# Patient Record
Sex: Female | Born: 1980 | Hispanic: No | Marital: Married | Smoking: Never smoker
Health system: Southern US, Community
[De-identification: ages and names within clinical notes are randomized; demographics above are authoritative.]

## PROBLEM LIST (undated history)

## (undated) ENCOUNTER — Inpatient Hospital Stay (HOSPITAL_COMMUNITY): Payer: Self-pay

## (undated) DIAGNOSIS — Z789 Other specified health status: Secondary | ICD-10-CM

## (undated) DIAGNOSIS — F419 Anxiety disorder, unspecified: Secondary | ICD-10-CM

## (undated) HISTORY — PX: DILATION AND CURETTAGE OF UTERUS: SHX78

---

## 2015-05-06 ENCOUNTER — Inpatient Hospital Stay (HOSPITAL_COMMUNITY): Payer: 59

## 2015-05-06 ENCOUNTER — Inpatient Hospital Stay (HOSPITAL_COMMUNITY)
Admission: AD | Admit: 2015-05-06 | Discharge: 2015-05-06 | Disposition: A | Discharge status: Home / Self Care | Payer: 59 | Source: Ambulatory Visit | Attending: Obstetrics and Gynecology | Admitting: Obstetrics and Gynecology

## 2015-05-06 ENCOUNTER — Encounter (HOSPITAL_COMMUNITY): Payer: Self-pay | Admitting: *Deleted

## 2015-05-06 DIAGNOSIS — E039 Hypothyroidism, unspecified: Secondary | ICD-10-CM | POA: Diagnosis present

## 2015-05-06 DIAGNOSIS — O3431 Maternal care for cervical incompetence, first trimester: Secondary | ICD-10-CM | POA: Diagnosis not present

## 2015-05-06 DIAGNOSIS — O2620 Pregnancy care for patient with recurrent pregnancy loss, unspecified trimester: Secondary | ICD-10-CM | POA: Diagnosis present

## 2015-05-06 DIAGNOSIS — O26851 Spotting complicating pregnancy, first trimester: Secondary | ICD-10-CM | POA: Diagnosis not present

## 2015-05-06 DIAGNOSIS — Z3A01 Less than 8 weeks gestation of pregnancy: Secondary | ICD-10-CM | POA: Diagnosis not present

## 2015-05-06 DIAGNOSIS — N979 Female infertility, unspecified: Secondary | ICD-10-CM | POA: Diagnosis present

## 2015-05-06 DIAGNOSIS — O09299 Supervision of pregnancy with other poor reproductive or obstetric history, unspecified trimester: Secondary | ICD-10-CM

## 2015-05-06 DIAGNOSIS — O26859 Spotting complicating pregnancy, unspecified trimester: Secondary | ICD-10-CM

## 2015-05-06 LAB — ABO/RH: ABO/RH(D): A POS

## 2015-05-06 LAB — HCG, QUANTITATIVE, PREGNANCY: HCG, BETA CHAIN, QUANT, S: 23510 m[IU]/mL — AB (ref <5)

## 2015-05-06 NOTE — MAU Provider Note (Signed)
History   34 yo [id number] at 5 4/7 weeks presented unannounced c/o pinkish-brown spotting this am and mild cramping. Reports cramping as 2/10.  Hx remarkable for cerclage x 2, SAB x 3 (one with twins).  On vaginal progesterone suppositories.  Patient questions whether cerclage needed in this pregnancy.  In last pregnancy, received prophylactic cerclage, vaginal progesterone, Makena.  Advised patient plan of care would be appropriate to review with [facility] MD and likely, MFM.   Patient Active Problem List   Diagnosis Date Noted  . H/O incompetent cervix, currently pregnant--cerclage x 2 05/06/2015  . 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 Infertility, female 05/06/2015  . Spotting affecting pregnancy in first trimester 05/06/2015  . Hypothyroidism 05/06/2015    Chief Complaint  Patient presents with  . Abdominal Pain  . Vaginal Bleeding   HPI:  As above  OB History    Gravida Para Term Preterm AB TAB SAB Ectopic Multiple Living   1st pregnancy--SAB of twins--D&C 2 additional pregnancies with early SABs--D&Cs  2014--SVB, cerclage placed at 20 weeks due to effacement to 50%, delivered within few days of cerclage removal 2015--SVB, prophylactic cerclage placed, persistent previa that resolved late in pregnancy, delivered same day as cerclage removed  ? Uterine septum noted in past   Surgical Hx:  D&C x 3, cerclage x 2.   History  Substance Use Topics  . Smoking status: Not on file  . Smokeless tobacco: Not on file  . Alcohol Use: Not on file    Allergies: No Known Allergies  Prescriptions prior to admission  Medication Sig Dispense Refill Last Dose  . Docosahexaenoic Acid (DHA PO) Take 2 tablets by mouth.   Past Week at Unknown time  . Prenatal Vit-Fe Fumarate-FA (MULTIVITAMIN-PRENATAL) 27-0.8 MG TABS tablet Take 1 tablet by mouth daily at 12 noon.   05/05/2015 at Unknown time  . progesterone  (PROMETRIUM) 100 MG capsule Place 100 mg vaginally daily.   Past Week at Unknown time    ROS:  Vaginal spotting, mild cramping Physical Exam   Blood pressure 118/66, pulse 86, temperature 98 F (36.7 C), temperature source Oral, resp. rate 16, height  (1.575 m), weight 54.999 kg (121 lb 4 oz), last menstrual period 03/28/2015.    Physical Exam  In NAD Chest clear Heart RRR without murmur Abd soft, NT Pelvic--no blood in vault, small amount of light brown d/c.  Cervix appears closed, non-friable.  Long, closed, and firm to exam. Ext WNL  Recent Results (from the past 2160 hour(s))  ABO/Rh     Status: None (Preliminary result)   Collection Time: 05/06/15 10:29 AM  Result Value Ref Range   ABO/RH(D) A POS   hCG, quantitative, pregnancy     Status: Abnormal   Collection Time: 05/06/15 10:29 AM  Result Value Ref Range   hCG, Beta Chain, Quant, S 23510 (H) <5 mIU/mL    Comment:          GEST. AGE      CONC.  (mIU/mL)   <=1 WEEK        5 - 50     2 WEEKS       50 - 500     3 WEEKS       100 - 10,000     4 WEEKS     1,000 - 30,000     5  WEEKS     3,500 - 115,000   6-8 WEEKS     12,000 - 270,000    12 WEEKS     15,000 - 220,000        FEMALE AND NON-PREGNANT FEMALE:     LESS THAN 5 mIU/mL     ED Course  Assessment: Early pregnancy with spotting/mild cramping Hx recurrent SAB Hx incompetent cervix, with cerclage x 2  Plan: US for viability GC/chlamydia   Nigel BridgemanLATHAM, Emmarae Cowdery CNM, MSN 05/06/2015 12:28 PM  Addendum: Returned from US--no bleeding, very minor cramping.  US:  SIUGS, +YS, no fetal pole, no SCH, MSD 1.15, c/w 5 6/7 weeks.  Left ovary WNL, right ovary with CLC noted, small amount FF.  Reviewed findings with patient and husband, discussed limited information available in early gestation.  They are aware of possibilities of viable or non-viable pregnancy. Recommended f/u US in 1 week, with bleeding precautions. Has scheduled NOB interview, w/u, and US on 7/18  (US to follow those appts).  Nigel BridgemanVicki Jade Burright, CNM 05/06/15 2:45p   Support to patient and husband for desired pregnancy. Note to office to schedule f/u US in 1 week.

## 2015-05-06 NOTE — Discharge Instructions (Signed)
Vaginal Bleeding During Pregnancy, First Trimester  A small amount of bleeding (spotting) from the vagina is relatively common in early pregnancy. It usually stops on its own. Various things may cause bleeding or spotting in early pregnancy. Some bleeding may be related to the pregnancy, and some may not. In most cases, the bleeding is normal and is not a problem. However, bleeding can also be a sign of something serious. Be sure to tell your health care provider about any vaginal bleeding right away.  Some possible causes of vaginal bleeding during the first trimester include:  · Infection or inflammation of the cervix.  · Growths (polyps) on the cervix.  · Miscarriage or threatened miscarriage.  · Pregnancy tissue has developed outside of the uterus and in a fallopian tube (tubal pregnancy).  · Tiny cysts have developed in the uterus instead of pregnancy tissue (molar pregnancy).  HOME CARE INSTRUCTIONS   Watch your condition for any changes. The following actions may help to lessen any discomfort you are feeling:  · Follow your health care provider's instructions for limiting your activity. If your health care provider orders bed rest, you may need to stay in bed and only get up to use the bathroom. However, your health care provider may allow you to continue light activity.  · If needed, make plans for someone to help with your regular activities and responsibilities while you are on bed rest.  · Keep track of the number of pads you use each day, how often you change pads, and how soaked (saturated) they are. Write this down.  · Do not use tampons. Do not douche.  · Do not have sexual intercourse or orgasms until approved by your health care provider.  · If you pass any tissue from your vagina, save the tissue so you can show it to your health care provider.  · Only take over-the-counter or prescription medicines as directed by your health care provider.  · Do not take aspirin because it can make you  bleed.  · Keep all follow-up appointments as directed by your health care provider.  SEEK MEDICAL CARE IF:  · You have any vaginal bleeding during any part of your pregnancy.  · You have cramps or labor pains.  · You have a fever, not controlled by medicine.  SEEK IMMEDIATE MEDICAL CARE IF:   · You have severe cramps in your back or belly (abdomen).  · You pass large clots or tissue from your vagina.  · Your bleeding increases.  · You feel light-headed or weak, or you have fainting episodes.  · You have chills.  · You are leaking fluid or have a gush of fluid from your vagina.  · You pass out while having a bowel movement.  MAKE SURE YOU:  · Understand these instructions.  · Will watch your condition.  · Will get help right away if you are not doing well or get worse.  Document Released: 07/24/2005 Document Revised: 10/19/2013 Document Reviewed: 06/21/2013  ExitCare® Patient Information ©2015 [employer]. This information is not intended to replace advice given to you by your health care provider. Make sure you discuss any questions you have with your health care provider.

## 2015-05-06 NOTE — MAU Note (Signed)
Pt states here for spotting that began this am. Noted when wiping only. Hx miscarriages and is anxious to make sure pregnancy is ok.

## 2015-05-08 LAB — GC/CHLAMYDIA PROBE AMP (~~LOC~~) NOT AT ARMC
CHLAMYDIA, DNA PROBE: NEGATIVE
Neisseria Gonorrhea: NEGATIVE

## 2015-06-14 LAB — OB RESULTS CONSOLE RPR: RPR: NONREACTIVE

## 2015-06-14 LAB — OB RESULTS CONSOLE HIV ANTIBODY (ROUTINE TESTING)
HIV: NONREACTIVE
HIV: NONREACTIVE

## 2015-06-14 LAB — OB RESULTS CONSOLE ABO/RH: RH Type: POSITIVE

## 2015-06-14 LAB — OB RESULTS CONSOLE ANTIBODY SCREEN: ANTIBODY SCREEN: NEGATIVE

## 2015-06-14 LAB — OB RESULTS CONSOLE RUBELLA ANTIBODY, IGM: RUBELLA: IMMUNE

## 2015-06-14 LAB — OB RESULTS CONSOLE HEPATITIS B SURFACE ANTIGEN: HEP B S AG: NEGATIVE

## 2015-06-16 ENCOUNTER — Other Ambulatory Visit: Payer: Self-pay | Admitting: Obstetrics and Gynecology

## 2015-07-05 ENCOUNTER — Ambulatory Visit (HOSPITAL_COMMUNITY): Payer: 59 | Admitting: Anesthesiology

## 2015-07-05 ENCOUNTER — Encounter (HOSPITAL_COMMUNITY): Payer: Self-pay | Admitting: *Deleted

## 2015-07-05 ENCOUNTER — Ambulatory Visit (HOSPITAL_COMMUNITY)
Admission: RE | Admit: 2015-07-05 | Discharge: 2015-07-05 | Disposition: A | Discharge status: Home / Self Care | Payer: 59 | Source: Ambulatory Visit | Attending: Obstetrics and Gynecology | Admitting: Obstetrics and Gynecology

## 2015-07-05 ENCOUNTER — Encounter (HOSPITAL_COMMUNITY)
Admission: RE | Disposition: A | Discharge status: Home / Self Care | Payer: Self-pay | Source: Ambulatory Visit | Attending: Obstetrics and Gynecology

## 2015-07-05 DIAGNOSIS — O3432 Maternal care for cervical incompetence, second trimester: Secondary | ICD-10-CM | POA: Insufficient documentation

## 2015-07-05 DIAGNOSIS — N883 Incompetence of cervix uteri: Secondary | ICD-10-CM | POA: Diagnosis present

## 2015-07-05 DIAGNOSIS — Z3A14 14 weeks gestation of pregnancy: Secondary | ICD-10-CM | POA: Insufficient documentation

## 2015-07-05 HISTORY — PX: CERVICAL CERCLAGE: SHX1329

## 2015-07-05 HISTORY — DX: Other specified health status: Z78.9

## 2015-07-05 HISTORY — DX: Anxiety disorder, unspecified: F41.9

## 2015-07-05 LAB — CBC
HEMATOCRIT: 38.4 % (ref 36.0–46.0)
Hemoglobin: 13 g/dL (ref 12.0–15.0)
MCH: 30 pg (ref 26.0–34.0)
MCHC: 33.9 g/dL (ref 30.0–36.0)
MCV: 88.5 fL (ref 78.0–100.0)
Platelets: 262 10*3/uL (ref 150–400)
RBC: 4.34 MIL/uL (ref 3.87–5.11)
RDW: 12.8 % (ref 11.5–15.5)
WBC: 14.9 10*3/uL — ABNORMAL HIGH (ref 4.0–10.5)

## 2015-07-05 SURGERY — CERCLAGE, CERVIX, VAGINAL APPROACH
Anesthesia: Spinal

## 2015-07-05 MED ORDER — ACETAMINOPHEN 160 MG/5ML PO SOLN
975.0000 mg | Freq: Once | ORAL | Status: AC
  Administered 2015-07-05: 975 mg via ORAL

## 2015-07-05 MED ORDER — SODIUM CHLORIDE 0.9 % IV SOLN
2.0000 g | Freq: Once | INTRAVENOUS | Status: AC
  Administered 2015-07-05: 2 g via INTRAVENOUS
  Filled 2015-07-05: qty 2000

## 2015-07-05 MED ORDER — FENTANYL CITRATE (PF) 100 MCG/2ML IJ SOLN: 25.0000 ug | INTRAMUSCULAR | Status: DC | PRN

## 2015-07-05 MED ORDER — SCOPOLAMINE 1 MG/3DAYS TD PT72
1.0000 | MEDICATED_PATCH | TRANSDERMAL | Status: DC
Start: 2015-07-05 — End: 2015-07-05
  Administered 2015-07-05: 1.5 mg via TRANSDERMAL

## 2015-07-05 MED ORDER — SCOPOLAMINE 1 MG/3DAYS TD PT72
MEDICATED_PATCH | TRANSDERMAL | Status: AC
  Administered 2015-07-05: 1.5 mg via TRANSDERMAL
  Filled 2015-07-05: qty 1

## 2015-07-05 MED ORDER — LIDOCAINE IN DEXTROSE 5-7.5 % IV SOLN
INTRAVENOUS | Status: DC | PRN
  Administered 2015-07-05: 1 mL via INTRATHECAL

## 2015-07-05 MED ORDER — ACETAMINOPHEN 160 MG/5ML PO SOLN
ORAL | Status: AC
  Administered 2015-07-05: 975 mg via ORAL
  Filled 2015-07-05: qty 40.6

## 2015-07-05 MED ORDER — SODIUM CHLORIDE 0.9 % IJ SOLN
Freq: Once | INTRAMUSCULAR | Status: AC
  Administered 2015-07-05: 30 mL via VAGINAL
  Filled 2015-07-05: qty 1

## 2015-07-05 MED ORDER — LACTATED RINGERS IV SOLN
INTRAVENOUS | Status: DC
  Administered 2015-07-05 (×2): via INTRAVENOUS

## 2015-07-05 SURGICAL SUPPLY — 19 items
CLOTH BEACON ORANGE TIMEOUT ST (SAFETY) ×3 IMPLANT
COUNTER NEEDLE 1200 MAGNETIC (NEEDLE) IMPLANT
GLOVE BIO SURGEON STRL SZ7.5 (GLOVE) ×3 IMPLANT
GLOVE BIOGEL PI IND STRL 7.5 (GLOVE) ×1 IMPLANT
GLOVE BIOGEL PI INDICATOR 7.5 (GLOVE) ×2
GOWN STRL REUS W/TWL LRG LVL3 (GOWN DISPOSABLE) ×6 IMPLANT
NS IRRIG 1000ML POUR BTL (IV SOLUTION) ×3 IMPLANT
PACK VAGINAL MINOR WOMEN LF (CUSTOM PROCEDURE TRAY) ×3 IMPLANT
PAD OB MATERNITY 4.3X12.25 (PERSONAL CARE ITEMS) ×3 IMPLANT
PAD PREP 24X48 CUFFED NSTRL (MISCELLANEOUS) ×3 IMPLANT
SUT MERSILENE 5MM BP 1 12 (SUTURE) ×3 IMPLANT
SUT PROLENE 1 CT 1 30 (SUTURE) ×3 IMPLANT
SYR BULB IRRIGATION 50ML (SYRINGE) ×3 IMPLANT
TOWEL OR 17X24 6PK STRL BLUE (TOWEL DISPOSABLE) ×3 IMPLANT
TRAY FOLEY CATH SILVER 14FR (SET/KITS/TRAYS/PACK) ×3 IMPLANT
TUBING NON-CON 1/4 X 20 CONN (TUBING) IMPLANT
TUBING NON-CON 1/4 X 20' CONN (TUBING)
WATER STERILE IRR 1000ML POUR (IV SOLUTION) ×3 IMPLANT
YANKAUER SUCT BULB TIP NO VENT (SUCTIONS) IMPLANT

## 2015-07-05 NOTE — Anesthesia Procedure Notes (Signed)
Spinal  Patient location during procedure: OR Preanesthetic Checklist Completed: patient identified, site marked, surgical consent, pre-op evaluation, timeout performed, IV checked, risks and benefits discussed and monitors and equipment checked Spinal Block Patient position: sitting Prep: DuraPrep Patient monitoring: heart rate, cardiac monitor, continuous pulse ox and blood pressure Approach: midline Location: L3-4 Injection technique: single-shot Needle Needle type: Sprotte  Needle gauge: 24 G Needle length: 9 cm Assessment Sensory level: T10 Additional Notes Spinal Dosage in OR  5% Xylocaine ml       1.0   

## 2015-07-05 NOTE — Discharge Instructions (Signed)
°  Cervical Cerclage, Care After °Refer to this sheet in the next few weeks. These instructions provide you with information on caring for yourself after your procedure. Your health care provider may also give you more specific instructions. Your treatment has been planned according to current medical practices, but problems sometimes occur. Call your health care provider if you have any problems or questions after your procedure. °WHAT TO EXPECT AFTER THE PROCEDURE  °After your procedure, it is typical to have the following: °· Abdominal cramping. °· Vaginal spotting. °HOME CARE INSTRUCTIONS  °· Only take over-the-counter or prescription medicines for pain, discomfort, or fever as directed by your health care provider. °· Avoid physical activities and exercise until your health care provider says it is okay. °· Do not douche or have sexual intercourse until your health care provider tells you it is okay. °· Keep your follow-up surgical and prenatal appointments with your health care provider. °SEEK MEDICAL CARE IF:  °· You have abnormal vaginal discharge. °· You have a rash. °· You become lightheaded or feel faint. °· You have abdominal pain that is not controlled with pain medicine. °SEEK IMMEDIATE MEDICAL CARE IF:  °· You develop vaginal bleeding. °· You are leaking fluid or have a gush of fluid from the vagina. °· You have a fever. °· You faint. °· You have uterine contractions. °· You feel your baby is not moving as much as usual, or you cannot feel your baby move. °· You have chest pain or shortness of breath. °Document Released: 08/04/2013 Document Revised: 10/19/2013 Document Reviewed: 08/04/2013 °ExitCare® Patient Information ©2015 [employer]. This information is not intended to replace advice given to you by your health care provider. Make sure you discuss any questions you have with your health care provider. ° °

## 2015-07-05 NOTE — Transfer of Care (Signed)
Immediate Anesthesia Transfer of Care Note  Patient: [facility]  Procedure(s) Performed: Procedure(s): CERCLAGE CERVICAL (N/A)  Patient Location: PACU  Anesthesia Type:Spinal  Level of Consciousness: awake, alert  and oriented  Airway & Oxygen Therapy: Patient Spontanous Breathing  Post-op Assessment: Report given to RN and Post -op Vital signs reviewed and stable  Post vital signs: Reviewed and stable  Last Vitals:  Filed Vitals:   07/05/15 1217  BP: 108/65  Pulse: 85  Temp: 37 C  Resp: 18    Complications: No apparent anesthesia complications

## 2015-07-05 NOTE — Anesthesia Preprocedure Evaluation (Addendum)

## 2015-07-05 NOTE — Anesthesia Postprocedure Evaluation (Signed)
  Anesthesia Post-op Note  Patient: Jodi Solis  Procedure(s) Performed: Procedure(s): CERCLAGE CERVICAL (N/A)  Patient is awake, responsive, moving her legs, and has signs of resolution of her numbness. Pain and nausea are reasonably well controlled. Vital signs are stable and clinically acceptable. Oxygen saturation is clinically acceptable. There are no apparent anesthetic complications at this time. Patient is ready for discharge.

## 2015-07-05 NOTE — H&P (Signed)
Jodi Solis is an 34 y.o. female. Pt with h/o incompetent cervix and 2 FT pregnancies with cervical cerclage.  Pt desires cervical cerclage and is scheduled for that today.  Pertinent Gynecological History: H/o cervical treatment in 2002 and neg paps since  Menstrual History: Patient's last menstrual period was 03/28/2015 (approximate).    Past Medical History  Diagnosis Date  . Medical history non-contributory   . Vaginal delivery 20014, 2015  . Anxiety 2011, 2013    Past Surgical History  Procedure Laterality Date  . Dilation and curettage of uterus      History reviewed. No pertinent family history.  Social History:  reports that she has never smoked. She does not have any smokeless tobacco history on file. She reports that she does not drink alcohol or use illicit drugs.  Allergies: No Known Allergies  Prescriptions prior to admission  Medication Sig Dispense Refill Last Dose  . Docosahexaenoic Acid (DHA PO) Take 2 tablets by mouth daily.    07/04/2015 at Unknown time  . Prenatal Vit-Fe Fumarate-FA (MULTIVITAMIN-PRENATAL) 27-0.8 MG TABS tablet Take 1 tablet by mouth daily at 12 noon.   07/04/2015 at Unknown time  . progesterone (PROMETRIUM) 100 MG capsule Place 100 mg vaginally daily.   06/27/2015 at Unknown time  . calcium carbonate (TUMS EX) 750 MG chewable tablet Chew 2 tablets by mouth daily as needed for heartburn.   More than a month at Unknown time    ROS Non-contributory  Blood pressure 108/65, pulse 85, temperature 98.6 F (37 C), temperature source Oral, resp. rate 18, height  (1.575 m), weight 58.968 kg (130 lb), last menstrual period 03/28/2015, SpO2 100 %. Physical Exam  Lungs CTA CV RRR Abd soft, NT Ext no calf tenderness  Results for orders placed or performed during the hospital encounter of 07/05/15 (from the past 24 hour(s))  CBC     Status: Abnormal   Collection Time: 07/05/15 12:07 PM  Result Value Ref Range   WBC 14.9 (H) 4.0 - 10.5 K/uL    RBC 4.34 3.87 - 5.11 MIL/uL   Hemoglobin 13.0 12.0 - 15.0 g/dL   HCT 16.1 09.6 - 04.5 %   MCV 88.5 78.0 - 100.0 fL   MCH 30.0 26.0 - 34.0 pg   MCHC 33.9 30.0 - 36.0 g/dL   RDW 40.9 81.1 - 91.4 %   Platelets 262 150 - 400 K/uL    No results found.  Assessment/Plan: P2 at 14 3/7 wks with h/o incompetent cervix scheduled for cerclage today.  Risks, benefits and alternatives discussed, questions answered and consent signed and witnessed.  1st trimester screen was negative.  F/u scheduled in office with u/s on 9/13 at 12:00p.  Tayvion Lauder Y 07/05/2015, 1:05 PM

## 2015-07-05 NOTE — Op Note (Signed)
Preop Diagnosis: Incompetent Cervix  Postop Diagnosis: Incompetent Cervix  Procedure: CERCLAGE CERVICAL   Anesthesia: Spinal   Anesthesiologist: Dr. Cristela Blue   Attending: Osborn Coho, MD   Assistant: N/a  Findings: Cervix closed and long  Pathology: N/a  Fluids: 1000 cc  UOP: 150 cc  EBL: Minimal  Complications: None  Procedure: The patient was taken to the operating room after the risks, benefits and alternatives discussed with the patient and consent signed and witnessed.  The patient was given a spinal per anesthesia and placed in the dorsal lithotomy position.  The patient was prepped and draped in the usual sterile fashion.  A cervical cerclage stitch was placed using Mersilene and the knot was tied anteriorly on the cervix with a stitch of 1 prolene at the base to help elevate knot if necessary when it comes time for removal.  Clindamycin douche was performed.  Membranes remained intact and post procedure fetal heart rate was 147.  Sponge, lap and needle count was correct and the patient was transferred to the recovery room in good condition.

## 2015-07-06 ENCOUNTER — Encounter (HOSPITAL_COMMUNITY): Payer: Self-pay | Admitting: Obstetrics and Gynecology

## 2015-09-20 ENCOUNTER — Ambulatory Visit (HOSPITAL_COMMUNITY)
Admission: RE | Admit: 2015-09-20 | Discharge: 2015-09-20 | Disposition: A | Discharge status: Home / Self Care | Payer: 59 | Source: Ambulatory Visit | Attending: Obstetrics & Gynecology | Admitting: Obstetrics & Gynecology

## 2015-09-20 ENCOUNTER — Encounter (HOSPITAL_COMMUNITY): Payer: Self-pay

## 2015-09-20 ENCOUNTER — Other Ambulatory Visit (HOSPITAL_COMMUNITY): Payer: Self-pay | Admitting: Maternal and Fetal Medicine

## 2015-09-20 ENCOUNTER — Other Ambulatory Visit (HOSPITAL_COMMUNITY): Payer: Self-pay | Admitting: Obstetrics & Gynecology

## 2015-09-20 DIAGNOSIS — O3432 Maternal care for cervical incompetence, second trimester: Secondary | ICD-10-CM

## 2015-09-20 DIAGNOSIS — Z8279 Family history of other congenital malformations, deformations and chromosomal abnormalities: Secondary | ICD-10-CM | POA: Diagnosis not present

## 2015-09-20 DIAGNOSIS — Z3A25 25 weeks gestation of pregnancy: Secondary | ICD-10-CM

## 2015-09-20 DIAGNOSIS — E039 Hypothyroidism, unspecified: Secondary | ICD-10-CM

## 2015-09-20 DIAGNOSIS — O99282 Endocrine, nutritional and metabolic diseases complicating pregnancy, second trimester: Secondary | ICD-10-CM

## 2015-09-20 MED ORDER — BETAMETHASONE SOD PHOS & ACET 6 (3-3) MG/ML IJ SUSP
12.0000 mg | Freq: Once | INTRAMUSCULAR | Status: AC
  Administered 2015-09-20: 12 mg via INTRAMUSCULAR
  Filled 2015-09-20: qty 2

## 2015-09-20 NOTE — Consult Note (Signed)
Maternal Fetal Medicine Consultation  Requesting Provider(s): Jodi BrownsEma Kulwa, MD  Reason for consultation: Cervical insufficiency s/p cerclage  HPI: Jodi Solis is a 34 yo [id number], EDD 01/02/2016 who is currently at 25w 1d seen for consultation due to cervical insufficiency and recommendations for management.  Jodi Solis's past OB history is remarkable for three prior early SABs.  During her 4th pregnancy in 2014, she was noted to have cervical shortening at 20 weeks and underwent an ultrasound indicated cerclage.  She subsequently delivered at 37 weeks without complications.  In 2015, she underwent a history indicated cerclage and also delivered at [redacted] weeks gestation.  She reports that during both of those pregnancies, she was treated with both 17-P injections as well as vaginal progesterone.  Jodi Solis had a cerclage performed earlier this pregnancy without complications.  She is currently on vaginal progesterone and would like to find out if she should also be on 17-P injections as well.  Over the last several weeks, she has had some preterm contractions but has not required admission.  She is without complaints today.  OB History: OB History    Gravida Para Term Preterm AB TAB SAB Ectopic Multiple Living   6 2 2  3     4       PMH:  Past Medical History  Diagnosis Date  . Medical history non-contributory   . Vaginal delivery 20014, 2015  . Anxiety 2011, 2013    PSH:  Past Surgical History  Procedure Laterality Date  . Dilation and curettage of uterus    . Cervical cerclage N/A 07/05/2015    Procedure: CERCLAGE CERVICAL;  Surgeon: Osborn CohoAngela Roberts, MD;  Location: [facility];  Service: Gynecology;  Laterality: N/A;  Hx of cervical procedure (? LEEP) due to dysplasia  Meds:  Current Outpatient Prescriptions on File Prior to Encounter  Medication Sig Dispense Refill  . calcium carbonate (TUMS EX) 750 MG chewable tablet Chew 2 tablets by mouth daily as needed for heartburn.    . Docosahexaenoic  Acid (DHA PO) Take 2 tablets by mouth daily.     . Prenatal Vit-Fe Fumarate-FA (MULTIVITAMIN-PRENATAL) 27-0.8 MG TABS tablet Take 1 tablet by mouth daily at 12 noon.    . progesterone (PROMETRIUM) 100 MG capsule Place 100 mg vaginally daily.     No current facility-administered medications on file prior to encounter.   Allergies: No Known Allergies   FH: reports that her sister had congenital heart disease that required surgery as a child  Soc:  Social History   Social History  . Marital Status: Married    Spouse Name: N/A  . Number of Children: N/A  . Years of Education: N/A   Occupational History  . Not on file.   Social History Main Topics  . Smoking status: Never Smoker   . Smokeless tobacco: Not on file  . Alcohol Use: No  . Drug Use: No  . Sexual Activity: Yes   Other Topics Concern  . Not on file   Social History Narrative    Review of Systems: no vaginal bleeding or cramping/contractions, no LOF, no nausea/vomiting. All other systems reviewed and are negative.  PE:   Filed Vitals:   09/20/15 0938  BP: 106/64  Pulse: 86    GEN: well-appearing female ABD: gravid, NT  Ultrasound: Single IUP at 25w 1d Cervical insufficiency s/p cerclage Limited ultrasound performed for cervical length  TVUS - cervical length 2.4 cm.  V-shaped funneling noted   A/P: 1) History of cervical  insufficiency s/p cerclage - While cervical length is s/p cerclage is excellent, some funneling is noted.  Based on this finding, would recommend a course of betamethasone.  The patient received her first dose following her clinic visit.  The patient's specific question is whether or not she should also be started on 17-P injections.  The benefit of progesterone supplementation (either vaginal or IM) following cerclage is uncertain.  Nevertheless, feel that adding Makena in addition to the patient's current dose of vaginal progesterone is a low risk intervention and that while it may not be  of prove benefit following cerclage there is little risk of harm.  I would not be against starting Makena if the patient desires this therapy.  The patient reports that she is being seen frequently in her clinic for cervical length surveillance.  Given the new finding of cervical funneling, would recommend a repeat cervical length in 1-2 weeks.  Please contact our office if you would prefer that this procedure be scheduled with MFM.   2) Family history of congenital heart disease - the patient elected to undergo a fetal echo with Peds cardiology that was scheduled following her clinic visit today.   Thank you for the opportunity to be a part of the care of [facility]. Please contact our office if we can be of further assistance.   I spent approximately 30 minutes with this patient with over 50% of time spent in face-to-face counseling.  Alpha Gula, MD Maternal Fetal Medicine

## 2015-09-21 ENCOUNTER — Inpatient Hospital Stay (HOSPITAL_COMMUNITY)
Admission: AD | Admit: 2015-09-21 | Discharge: 2015-09-21 | Disposition: A | Discharge status: Home / Self Care | Payer: 59 | Source: Ambulatory Visit | Attending: Obstetrics and Gynecology | Admitting: Obstetrics and Gynecology

## 2015-09-21 DIAGNOSIS — Z3A27 27 weeks gestation of pregnancy: Secondary | ICD-10-CM | POA: Insufficient documentation

## 2015-09-21 MED ORDER — BETAMETHASONE SOD PHOS & ACET 6 (3-3) MG/ML IJ SUSP
12.0000 mg | Freq: Once | INTRAMUSCULAR | Status: AC
  Administered 2015-09-21: 12 mg via INTRAMUSCULAR
  Filled 2015-09-21: qty 2

## 2015-10-29 NOTE — L&D Delivery Note (Signed)
Delivery Note 0006: Patient arrive in MAU after SROM at 2310.  VE 8/90/+2.  Patient to [facility] for imminent delivery and delivered as below with staff and family support.   At 12:21 AM, on Dec 18, 2015, a viable female "Name Unknown" was delivered via Vaginal, Spontaneous Delivery (Presentation: Left Occiput Anterior with restitution to LOT).  Shoulders delivered easily and infant with good tone and spontaneous cry. Tactile stimulation given by provider and infant placed on mother's abdomen where nurse continued tactile stimulation and provided bulb suction. Infant APGAR: 8, 9. Cord clamped, cut, and blood collected. Patient given 10mL of IM pitocin in left leg.  Placenta delivered spontaneously and noted to be intact with 3VC upon inspection. Vaginal inspection revealed no lacerations.  Fundus firm, at the umbilicus, and bleeding small.  Mother hemodynamically stable and infant skin to skin prior to provider exit.  Mother unsure of birth control method and opts to breastfeed.  Family wishes for infant to be circumcised during inpatient stay.  Infant weight at one hour of life: 7lbs 6.3oz, 19.5in  Anesthesia: None  Episiotomy: None Lacerations: None Suture Repair: None Est. Blood Loss (mL): 300  Mom to postpartum.  Baby to Couplet care / Skin to Skin.  Cherre Robins MSN, CNM 12/18/2015, 1:26 AM

## 2015-11-13 ENCOUNTER — Inpatient Hospital Stay (HOSPITAL_COMMUNITY): Payer: 59

## 2015-11-13 ENCOUNTER — Inpatient Hospital Stay (HOSPITAL_COMMUNITY)
Admission: AD | Admit: 2015-11-13 | Discharge: 2015-11-13 | Disposition: A | Discharge status: Home / Self Care | Payer: 59 | Source: Ambulatory Visit | Attending: Obstetrics and Gynecology | Admitting: Obstetrics and Gynecology

## 2015-11-13 ENCOUNTER — Encounter (HOSPITAL_COMMUNITY): Payer: Self-pay | Admitting: *Deleted

## 2015-11-13 DIAGNOSIS — O3433 Maternal care for cervical incompetence, third trimester: Secondary | ICD-10-CM | POA: Diagnosis not present

## 2015-11-13 DIAGNOSIS — O26853 Spotting complicating pregnancy, third trimester: Secondary | ICD-10-CM | POA: Insufficient documentation

## 2015-11-13 DIAGNOSIS — N883 Incompetence of cervix uteri: Secondary | ICD-10-CM

## 2015-11-13 DIAGNOSIS — Z3A32 32 weeks gestation of pregnancy: Secondary | ICD-10-CM | POA: Insufficient documentation

## 2015-11-13 LAB — URINALYSIS, ROUTINE W REFLEX MICROSCOPIC
Bilirubin Urine: NEGATIVE
GLUCOSE, UA: 250 mg/dL — AB
KETONES UR: 15 mg/dL — AB
LEUKOCYTES UA: NEGATIVE
Nitrite: NEGATIVE
PROTEIN: NEGATIVE mg/dL
Specific Gravity, Urine: 1.02 (ref 1.005–1.030)
pH: 6.5 (ref 5.0–8.0)

## 2015-11-13 LAB — WET PREP, GENITAL
CLUE CELLS WET PREP: NONE SEEN
Sperm: NONE SEEN
Trich, Wet Prep: NONE SEEN
Yeast Wet Prep HPF POC: NONE SEEN

## 2015-11-13 LAB — URINE MICROSCOPIC-ADD ON: Bacteria, UA: NONE SEEN

## 2015-11-13 NOTE — Discharge Instructions (Signed)
Cervical Insufficiency °Cervical insufficiency is when the cervix is weak and starts to open (dilate) and thin (efface) before the pregnancy is at term and without labor starting. This is also called incompetent cervix. It can happen in the second or third trimester when the fetus starts putting pressure on the cervix. Cervical insufficiency can lead to a miscarriage, preterm premature rupture of the membranes (PPROM), or having the baby early (preterm birth).  °RISK FACTORS °You may be more likely to develop cervical insufficiency if: °· You have a shorter cervix than normal. °· Damage or injury occurred to your cervix from a past pregnancy or surgery. °· You were born with a cervical defect. °· You have had a procedure done on the cervix, such as cervical biopsy. °· You have a history of cervical insufficiency. °· You have a history of PPROM. °· You have ended several past pregnancies through abortion. °· You were exposed to the drug diethylstilbestrol (DES). °SYMPTOMS °Often times, women do not have any symptoms. Other times, women may only have mild symptoms that often start between week 14 through 20. The symptoms may last several days or weeks. These symptoms include: °· Light spotting or bleeding from the vagina. °· Pelvic pressure. °· A change in vaginal discharge, such as discharge that changes from clear, white, or light yellow to pink or tan. °· Back pain. °· Abdominal pain or cramping. °DIAGNOSIS °Cervical insufficiency cannot be diagnosed before you become pregnant. Once you are pregnant, your health care provider will ask about your medical history and if you have had any problems in past pregnancies. Tell your health care provider about any procedures performed on your cervix or if you have a history of miscarriages or cervical insufficiency. If your health care provider thinks you are at high risk for cervical insufficiency or show signs of cervical insufficiency, he or she may: °· Perform a pelvic  exam. This will check for: °¨ The presence of the membranes (amniotic sac) coming out of the cervix. °¨ Cervical abnormalities. °¨ Cervical injuries. °¨ The presence of contractions. °· Perform an ultrasonography (commonly called ultrasound) to measure the length and thickness of the cervix. °TREATMENT °If you have been diagnosed with cervical insufficiency, your health care provider may recommend: °· Limiting physical activity. °· Bed rest at home or in the hospital. °· Pelvic rest, which means no sexual intercourse or placing anything in the vagina. °· Cerclage to sew the cervix closed and prevent it from opening too early. The stitches (sutures) are removed between weeks 36 and 38 to avoid problems during labor. °Cerclage may be recommended during pregnancy if you have had a history of miscarriages or preterm births without a known cause. It may also be recommended if you have a short cervix that was identified by ultrasound or if your health care provider has found that your cervix has dilated before 24 weeks of pregnancy. Limiting physical activity and bed rest may or may not help prevent a preterm birth. °WHEN SHOULD YOU SEEK IMMEDIATE MEDICAL CARE?  °Seek immediate medical care if you show any symptoms of cervical insufficiency. You will need to go to the hospital to get checked immediately. °  °This information is not intended to replace advice given to you by your health care provider. Make sure you discuss any questions you have with your health care provider. °  °Document Released: 10/14/2005 Document Revised: 11/04/2014 Document Reviewed: 12/21/2012 °Elsevier Interactive Patient Education ©2016 [employer] ° °

## 2015-11-13 NOTE — MAU Note (Signed)
Pt presents to MAU with complaints of vaginal spotting when she wipes. Denies any pain at present. Cerclage placed at 12weeks.

## 2015-11-13 NOTE — MAU Note (Signed)
Pt. States she had a small amount of bright red bleeding when wiping yesterday x1. Today had light pink discharge x1. Denies cramping. Baby is moving well. Denies LOF. Next appointment is scheduled Thursday.

## 2015-11-13 NOTE — MAU Provider Note (Signed)
History   35 yo [id number] at 32 6/7 weeks presented after calling this am with pink spotting x 1 today, single episode of bright red spotting yesterday on first void.  Denies any increase in baseline mild cramping.  Is maintaining BR at home and pelvic rest.    Cerclage placed at 14 3/7 weeks for prophylaxis--hx of emergency cerclage at 20 weeks with 1st pregnancy, prophylaxis cerclage placed with 2nd pregnancy.  Delivered both at 37 weeks, soon after cerclage removal.  Last US for cervical length on 11/03/15--vtx, normal fluid, EFW 1921 gm, 70%ile.  Cervix 2.0, funneling of internal cervix os noted.    Patient Active Problem List   Diagnosis Date Noted  . H/O incompetent cervix, currently pregnant--cerclage x 2 05/06/2015  . 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 Infertility, female 05/06/2015  . Spotting affecting pregnancy in first trimester 05/06/2015  . Hypothyroidism 05/06/2015    Chief Complaint  Patient presents with  . Vaginal Bleeding   HPI:  As above  OB History    Gravida Para Term Preterm AB TAB SAB Ectopic Multiple Living   6 2 2  3     2       Past Medical History  Diagnosis Date  . Medical history non-contributory   . Vaginal delivery 20014, 2015  . Anxiety 2011, 2013    Past Surgical History  Procedure Laterality Date  . Dilation and curettage of uterus    . Cervical cerclage N/A 07/05/2015    Procedure: CERCLAGE CERVICAL;  Surgeon: Osborn CohoAngela Roberts, MD;  Location: [facility];  Service: Gynecology;  Laterality: N/A;    History reviewed. No pertinent family history.  Social History  Substance Use Topics  . Smoking status: Never Smoker   . Smokeless tobacco: None  . Alcohol Use: No    Allergies: No Known Allergies  Prescriptions prior to admission  Medication Sig Dispense Refill Last Dose  . Cholecalciferol (VITAMIN D3) 5000 units TABS Take 1 tablet by mouth daily.   11/12/2015 at Unknown time  .  Docosahexaenoic Acid (DHA PO) Take 2 tablets by mouth daily.    11/13/2015 at Unknown time  . Prenatal Vit-Fe Fumarate-FA (MULTIVITAMIN-PRENATAL) 27-0.8 MG TABS tablet Take 1 tablet by mouth daily at 12 noon.   11/13/2015 at Unknown time  . progesterone (PROMETRIUM) 100 MG capsule Place 100 mg vaginally at bedtime.    11/12/2015 at Unknown time    ROS:  Pink spotting x 1 this am, mild cramping (no more than usual), +FM  Physical Exam   Blood pressure 109/70, pulse 104, temperature 98.1 F (36.7 C), resp. rate 18, last menstrual period 03/28/2015.    Physical Exam In NAD Chest clear Heart RRR without murmur Abd gravid, NT Pelvic--speculum exam:  Cervix closed, cerclage in place, does not appear to have significant pressure against it.  Copious light yellow d/c in vault  FHR Category 1 UCs--single UC in 34 minutes, with mild irritabilty ED Course  Assessment: IUP at 32 6/7 weeks Hx incompetent cervix--cerclage in place Occasional spotting On BR at home.  Plan: Wet prep Limited US for cervical length assessment.   Nigel BridgemanLATHAM, Jaidev Sanger CNM, MSN 11/13/2015 10:58 AM  Addendum: Returned from US: Cervical length 2.0, cerclage stable Vtx, AFI 21.1, 80%ile.  Results for orders placed or performed during the hospital encounter of 11/13/15 (from the past 24 hour(s))  Urinalysis, Routine w reflex microscopic (not at [facility])     Status: Abnormal  Collection Time: 11/13/15 10:21 AM  Result Value Ref Range   Color, Urine YELLOW YELLOW   APPearance CLEAR CLEAR   Specific Gravity, Urine 1.020 1.005 - 1.030   pH 6.5 5.0 - 8.0   Glucose, UA 250 (A) NEGATIVE mg/dL   Hgb urine dipstick TRACE (A) NEGATIVE   Bilirubin Urine NEGATIVE NEGATIVE   Ketones, ur 15 (A) NEGATIVE mg/dL   Protein, ur NEGATIVE NEGATIVE mg/dL   Nitrite NEGATIVE NEGATIVE   Leukocytes, UA NEGATIVE NEGATIVE  Urine microscopic-add on     Status: Abnormal   Collection Time: 11/13/15 10:21 AM  Result Value Ref Range    Squamous Epithelial / LPF 0-5 (A) NONE SEEN   WBC, UA 0-5 0 - 5 WBC/hpf   RBC / HPF 0-5 0 - 5 RBC/hpf   Bacteria, UA NONE SEEN NONE SEEN  Wet prep, genital     Status: Abnormal   Collection Time: 11/13/15 10:40 AM  Result Value Ref Range   Yeast Wet Prep HPF POC NONE SEEN NONE SEEN   Trich, Wet Prep NONE SEEN NONE SEEN   Clue Cells Wet Prep HPF POC NONE SEEN NONE SEEN   WBC, Wet Prep HPF POC MANY (A) NONE SEEN   Sperm NONE SEEN    Pushed po fluids prior to [place] due to ketones in urine.  FHR Category 1 UCs occasional irritability, more with full bladder.  Approx 1-2 UCs per hour, patient reports "same as usual".  Plan: D/C home with continued BR and pelvic rest. Keep scheduled appts for 17P on 11/15/15 and ROB visit on 11/16/15.  If status remains stable, will cancel [place] for cervical length on Thursday, since cerclage and cervical length were stable on today's [place]. Watch AFI. Patient instructed to increase po hydration.  Nigel Bridgeman, CNM 11/13/15 2:25p

## 2015-11-28 ENCOUNTER — Inpatient Hospital Stay (HOSPITAL_COMMUNITY)
Admission: AD | Admit: 2015-11-28 | Discharge: 2015-11-28 | Disposition: A | Discharge status: Home / Self Care | Payer: 59 | Source: Ambulatory Visit | Attending: Obstetrics and Gynecology | Admitting: Obstetrics and Gynecology

## 2015-11-28 ENCOUNTER — Encounter (HOSPITAL_COMMUNITY): Payer: Self-pay | Admitting: *Deleted

## 2015-11-28 DIAGNOSIS — Z3A35 35 weeks gestation of pregnancy: Secondary | ICD-10-CM | POA: Diagnosis not present

## 2015-11-28 DIAGNOSIS — O3433 Maternal care for cervical incompetence, third trimester: Secondary | ICD-10-CM | POA: Diagnosis not present

## 2015-11-28 DIAGNOSIS — O368130 Decreased fetal movements, third trimester, not applicable or unspecified: Secondary | ICD-10-CM | POA: Diagnosis not present

## 2015-11-28 DIAGNOSIS — O09293 Supervision of pregnancy with other poor reproductive or obstetric history, third trimester: Secondary | ICD-10-CM

## 2015-11-28 NOTE — MAU Provider Note (Signed)
History    Jodi Solis is a 34y.o. [id number] at 35wks who presents, unannounced, for decreased fetal movement.  Patient reports last faint movement felt at 1700.  Patient reports eating at 1500 with no movement before or after that.  Patient denies pain, VB, and LOF.    Patient Active Problem List   Diagnosis Date Noted  . H/O incompetent cervix, currently pregnant--cerclage x 2 05/06/2015  . 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 Infertility, female 05/06/2015  . Spotting affecting pregnancy in first trimester 05/06/2015  . Hypothyroidism 05/06/2015    Chief Complaint  Patient presents with  . Decreased Fetal Movement   HPI  OB History    Gravida Para Term Preterm AB TAB SAB Ectopic Multiple Living   Past Medical History  Diagnosis Date  . Medical history non-contributory   . Vaginal delivery 20014, 2015  . Anxiety 2011, 2013    Past Surgical History  Procedure Laterality Date  . Dilation and curettage of uterus    . Cervical cerclage N/A 07/05/2015    Procedure: CERCLAGE CERVICAL;  Surgeon: Osborn Coho, MD;  Location: [facility];  Service: Gynecology;  Laterality: N/A;    History reviewed. No pertinent family history.  Social History  Substance Use Topics  . Smoking status: Never Smoker   . Smokeless tobacco: None  . Alcohol Use: No    Allergies: No Known Allergies  Prescriptions prior to admission  Medication Sig Dispense Refill Last Dose  . Cholecalciferol (VITAMIN D3) 5000 units TABS Take 1 tablet by mouth daily.   11/12/2015 at Unknown time  . Docosahexaenoic Acid (DHA PO) Take 2 tablets by mouth daily.    11/13/2015 at Unknown time  . Prenatal Vit-Fe Fumarate-FA (MULTIVITAMIN-PRENATAL) 27-0.8 MG TABS tablet Take 1 tablet by mouth daily at 12 noon.   11/13/2015 at Unknown time  . progesterone (PROMETRIUM) 100 MG capsule Place 100 mg vaginally at bedtime.    11/12/2015 at Unknown time     ROS  See HPI Above Physical Exam   Blood pressure 123/78, pulse 91, temperature 98.1 F (36.7 C), temperature source Oral, resp. rate 18, height  (1.575 m), weight 70.761 kg (156 lb), last menstrual period 03/28/2015.  No results found for this or any previous visit (from the past 24 hour(s)).  Physical Exam   FHR: 140 bpm, Mod Var, -Decels, +Accels UC: Q5-41min, palpates mild ED Course  Assessment: IUP at 35wks Cat I FT DFM Cerclage  Plan: -Reactive NST -Discharge to home with labor precautions -Keep appt as scheduled -Encouraged to call if any questions or concerns arise prior to next scheduled office visit.   Cherre Robins CNM, MSN 11/28/2015 7:39 PM

## 2015-11-28 NOTE — Discharge Instructions (Signed)

## 2015-11-28 NOTE — MAU Note (Signed)
Urine in lab 

## 2015-12-18 ENCOUNTER — Encounter (HOSPITAL_COMMUNITY): Payer: Self-pay

## 2015-12-18 ENCOUNTER — Inpatient Hospital Stay (HOSPITAL_COMMUNITY)
Admission: AD | Admit: 2015-12-18 | Discharge: 2015-12-20 | DRG: 775 | Disposition: A | Discharge status: Home / Self Care | Payer: 59 | Source: Ambulatory Visit | Attending: Obstetrics and Gynecology | Admitting: Obstetrics and Gynecology

## 2015-12-18 DIAGNOSIS — O99284 Endocrine, nutritional and metabolic diseases complicating childbirth: Secondary | ICD-10-CM | POA: Diagnosis present

## 2015-12-18 DIAGNOSIS — Z3A37 37 weeks gestation of pregnancy: Secondary | ICD-10-CM

## 2015-12-18 DIAGNOSIS — O3433 Maternal care for cervical incompetence, third trimester: Secondary | ICD-10-CM | POA: Diagnosis present

## 2015-12-18 DIAGNOSIS — E039 Hypothyroidism, unspecified: Secondary | ICD-10-CM | POA: Diagnosis present

## 2015-12-18 LAB — CBC
HCT: 38.4 % (ref 36.0–46.0)
HCT: 36.1 % (ref 36.0–46.0)
Hemoglobin: 13.4 g/dL (ref 12.0–15.0)
Hemoglobin: 12.3 g/dL (ref 12.0–15.0)
MCH: 30.2 pg (ref 26.0–34.0)
MCH: 29.8 pg (ref 26.0–34.0)
MCHC: 34.9 g/dL (ref 30.0–36.0)
MCHC: 34.1 g/dL (ref 30.0–36.0)
MCV: 86.7 fL (ref 78.0–100.0)
MCV: 87.4 fL (ref 78.0–100.0)
PLATELETS: 297 10*3/uL (ref 150–400)
Platelets: 311 K/uL (ref 150–400)
RBC: 4.43 MIL/uL (ref 3.87–5.11)
RBC: 4.13 MIL/uL (ref 3.87–5.11)
RDW: 13.7 % (ref 11.5–15.5)
RDW: 13.5 % (ref 11.5–15.5)
WBC: 22 10*3/uL — AB (ref 4.0–10.5)
WBC: 22.6 K/uL — ABNORMAL HIGH (ref 4.0–10.5)

## 2015-12-18 LAB — TYPE AND SCREEN
ABO/RH(D): A POS
Antibody Screen: NEGATIVE

## 2015-12-18 LAB — RPR: RPR Ser Ql: NONREACTIVE

## 2015-12-18 MED ORDER — FENTANYL CITRATE (PF) 100 MCG/2ML IJ SOLN: 50.0000 ug | INTRAMUSCULAR | Status: DC | PRN

## 2015-12-18 MED ORDER — ZOLPIDEM TARTRATE 5 MG PO TABS: 5.0000 mg | ORAL_TABLET | Freq: Every evening | ORAL | Status: DC | PRN

## 2015-12-18 MED ORDER — INFLUENZA VAC SPLIT QUAD 0.5 ML IM SUSY
0.5000 mL | PREFILLED_SYRINGE | INTRAMUSCULAR | Status: AC
  Administered 2015-12-19: 0.5 mL via INTRAMUSCULAR

## 2015-12-18 MED ORDER — SIMETHICONE 80 MG PO CHEW: 80.0000 mg | CHEWABLE_TABLET | ORAL | Status: DC | PRN

## 2015-12-18 MED ORDER — WITCH HAZEL-GLYCERIN EX PADS: 1.0000 "application " | MEDICATED_PAD | CUTANEOUS | Status: DC | PRN

## 2015-12-18 MED ORDER — SENNOSIDES-DOCUSATE SODIUM 8.6-50 MG PO TABS
2.0000 | ORAL_TABLET | ORAL | Status: DC
  Administered 2015-12-19 (×2): 2 via ORAL
  Filled 2015-12-18 (×2): qty 2

## 2015-12-18 MED ORDER — OXYTOCIN 10 UNIT/ML IJ SOLN
INTRAMUSCULAR | Status: AC
  Administered 2015-12-18: 10 [IU]
  Filled 2015-12-18: qty 1

## 2015-12-18 MED ORDER — BENZOCAINE-MENTHOL 20-0.5 % EX AERO
1.0000 "application " | INHALATION_SPRAY | CUTANEOUS | Status: DC | PRN
  Administered 2015-12-18: 1 via TOPICAL
  Filled 2015-12-18: qty 56

## 2015-12-18 MED ORDER — CITRIC ACID-SODIUM CITRATE 334-500 MG/5ML PO SOLN: 30.0000 mL | ORAL | Status: DC | PRN

## 2015-12-18 MED ORDER — ONDANSETRON HCL 4 MG PO TABS: 4.0000 mg | ORAL_TABLET | ORAL | Status: DC | PRN

## 2015-12-18 MED ORDER — ACETAMINOPHEN 325 MG PO TABS: 650.0000 mg | ORAL_TABLET | ORAL | Status: DC | PRN

## 2015-12-18 MED ORDER — ONDANSETRON HCL 4 MG/2ML IJ SOLN: 4.0000 mg | INTRAMUSCULAR | Status: DC | PRN

## 2015-12-18 MED ORDER — IBUPROFEN 600 MG PO TABS
600.0000 mg | ORAL_TABLET | Freq: Four times a day (QID) | ORAL | Status: DC
  Administered 2015-12-18 – 2015-12-20 (×9): 600 mg via ORAL
  Filled 2015-12-18 (×9): qty 1

## 2015-12-18 MED ORDER — LACTATED RINGERS IV SOLN: INTRAVENOUS | Status: DC

## 2015-12-18 MED ORDER — LIDOCAINE HCL (PF) 1 % IJ SOLN
30.0000 mL | INTRAMUSCULAR | Status: DC | PRN
  Filled 2015-12-18: qty 30

## 2015-12-18 MED ORDER — TETANUS-DIPHTH-ACELL PERTUSSIS 5-2.5-18.5 LF-MCG/0.5 IM SUSP: 0.5000 mL | Freq: Once | INTRAMUSCULAR | Status: DC

## 2015-12-18 MED ORDER — OXYTOCIN 10 UNIT/ML IJ SOLN
INTRAMUSCULAR | Status: AC
  Filled 2015-12-18: qty 1

## 2015-12-18 MED ORDER — DIPHENHYDRAMINE HCL 25 MG PO CAPS: 25.0000 mg | ORAL_CAPSULE | Freq: Four times a day (QID) | ORAL | Status: DC | PRN

## 2015-12-18 MED ORDER — LIDOCAINE HCL (PF) 1 % IJ SOLN
INTRAMUSCULAR | Status: AC
  Filled 2015-12-18: qty 30

## 2015-12-18 MED ORDER — ONDANSETRON HCL 4 MG/2ML IJ SOLN: 4.0000 mg | Freq: Four times a day (QID) | INTRAMUSCULAR | Status: DC | PRN

## 2015-12-18 MED ORDER — PRENATAL MULTIVITAMIN CH
1.0000 | ORAL_TABLET | Freq: Every day | ORAL | Status: DC
  Administered 2015-12-18 – 2015-12-19 (×2): 1 via ORAL
  Filled 2015-12-18 (×2): qty 1

## 2015-12-18 MED ORDER — LANOLIN HYDROUS EX OINT: TOPICAL_OINTMENT | CUTANEOUS | Status: DC | PRN

## 2015-12-18 MED ORDER — OXYTOCIN 10 UNIT/ML IJ SOLN: 2.5000 [IU]/h | INTRAVENOUS | Status: DC

## 2015-12-18 MED ORDER — OXYTOCIN BOLUS FROM INFUSION: 500.0000 mL | INTRAVENOUS | Status: DC

## 2015-12-18 MED ORDER — DIBUCAINE 1 % RE OINT: 1.0000 "application " | TOPICAL_OINTMENT | RECTAL | Status: DC | PRN

## 2015-12-18 MED ORDER — LACTATED RINGERS IV SOLN: 500.0000 mL | INTRAVENOUS | Status: DC | PRN

## 2015-12-18 NOTE — Lactation Note (Addendum)
This note was copied from a baby's chart. Lactation Consultation Note  P3, Ex BF 3-4 months.  States had trouble keeping her milk supply once she went back to work.  She was supplementing w/ formula. Reviewed supply and demand and discussed the importance of exclusive breastfeeding for first 2 weeks. Mother latched baby in cradle hold.  Mother noted some biting/pinching.  When unlatched noted some compression. Repositioned baby deeper on breast.  Taught FOB how to do chin tug to help mother latch baby deeper. Sucks and swallows observed. Provided mother with comfort gels for small positional stripe and bruising. Mom encouraged to feed baby 8-12 times/24 hours and with feeding cues.  Mom made aware of O/P services, breastfeeding support groups, community resources, and our phone # for post-discharge questions.     Patient Name: Jodi Solis [id number] Date: 12/18/2015 Reason for consult: Initial assessment   Maternal Data Has patient been taught Hand Expression?: Yes Does the patient have breastfeeding experience prior to this delivery?: Yes  Feeding Feeding Type: Breast Fed Length of feed: 2 min  LATCH Score/Interventions Latch: Grasps breast easily, tongue down, lips flanged, rhythmical sucking.  Audible Swallowing: Spontaneous and intermittent  Type of Nipple: Everted at rest and after stimulation  Comfort (Breast/Nipple): Filling, red/small blisters or bruises, mild/mod discomfort  Problem noted: Mild/Moderate discomfort Interventions (Mild/moderate discomfort): Hand expression;Comfort gels  Hold (Positioning): Assistance needed to correctly position infant at breast and maintain latch.  LATCH Score: 8  Lactation Tools Discussed/Used     Consult Status Consult Status: Follow-up Date: 12/19/15 Follow-up type: In-patient    Dahlia Byes [facility] 12/18/2015, 2:51 PM

## 2015-12-18 NOTE — H&P (Signed)
Jodi Solis is a 35 y.o. female, [phone] at 37.6 weeks, presenting for active labor.  Patient reports SROM at 2310 with contractions throughout the day. Patient arrived in MAU for evaluation and was 8/100/+2.  Patient desired epidural and is GBS negative.    Patient Active Problem List   Diagnosis Date Noted  . Labor and delivery, indication for care 12/18/2015  . SVD (spontaneous vaginal delivery) 12/18/2015  . H/O incompetent cervix, currently pregnant--cerclage x 2 05/06/2015  . 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 Infertility, female 05/06/2015  . Spotting affecting pregnancy in first trimester 05/06/2015  . Hypothyroidism 05/06/2015    History of present pregnancy: Patient entered care at 8 weeks.   EDC of 01/02/2015 was established by Definite LMP of 03/28/2015 and confirmed by 5.6wk Korea on 05/06/2015.   Anatomy scan:  18.4 weeks, with normal findings.   Additional [place] evaluations:   22.4wks: F/U anatomy-U/S: SIUP, VERTEX, NORMAL FLUID, GROWTH 54%, CX 3.48 CM. 25.1wks:  CERVIX 2.1- 2.7 CM 26.4wks: CL 2.4 CM WITH FUNNELLING 27.3wks:  u/s today, breech, cervical length=2.4, Cerclage in place, funneling noted 31.5wks: [place]: VERTEX. ANTERIOR PLACENTA. FLUID IS NORMAL- AFI=60TH%. EFW=1921G,70TH, CERVIX MEASURED TRANSVAGINALLY. CERCLAGE SEEN-CX MEASURES 2.0CM. FUNNELING OF INTERNAL OS NOTED 35.1wks: BPP 8/8, nl fluid, vtx. 36.2wks: NORMAL GROWTH, BPP [place]. AFI 21 CM 37.1wks:  BPP 8/8, vtx, nl fluid. Significant prenatal events: 1st Trimester: Fatigue and cramping 2nd Trimester:Placement of cervical cerclage.  Initiation of vaginal progesterone and 17P injections.  C/O BH Contractions.  3rd Trimester:  Evaluated for pink spotting.  Cerclage removal   Last evaluation:  12/14/2015 in office by Dr. Alinda Sierras.  FHR 147. BP 94/60 SVE 2/50/-3  OB History    Gravida Para Term Preterm AB TAB SAB Ectopic Multiple Living   Past Medical History  Diagnosis Date  . Medical history non-contributory   . Vaginal delivery 20014, 2015  . Anxiety 2011, 2013   Past Surgical History  Procedure Laterality Date  . Dilation and curettage of uterus    . Cervical cerclage N/A 07/05/2015    Procedure: CERCLAGE CERVICAL;  Surgeon: Osborn Coho, MD;  Location: [facility];  Service: Gynecology;  Laterality: N/A;   Family History: family history is not on file. Social History:  reports that she has never smoked. She does not have any smokeless tobacco history on file. She reports that she does not drink alcohol or use illicit drugs.  Patient is a Health visitor.  She is married to husband Peyton Najjar who is present and supportive.   Prenatal Transfer Tool  Maternal Diabetes: No Genetic Screening: Normal Maternal Ultrasounds/Referrals: Normal Fetal Ultrasounds or other Referrals:  Fetal echo Maternal Substance Abuse:  No Significant Maternal Medications:  Meds include: Progesterone Other: 17P Significant Maternal Lab Results: Lab values include: Group B Strep negative    ROS:  +LOF, +Ctx, +FM  No Known Allergies     Blood pressure 139/82, pulse 96, resp. rate 20, last menstrual period 03/28/2015, SpO2 99 %.  Physical Exam  Vitals reviewed. Constitutional: She is oriented to person, place, and time. She appears well-developed and well-nourished. She appears distressed (W/ Ctx).  HENT:  Head: Normocephalic and atraumatic.  Eyes: Conjunctivae are normal.  Neck: Normal range of motion.  Cardiovascular: Normal rate.   Respiratory: Effort normal.  GI: Soft.  Musculoskeletal: Normal range of motion. She exhibits no edema.  Neurological: She is alert and oriented to person, place, and time.  Skin: Skin is warm and dry.    [place]: EFW: Not Assessed Presentation: Vertex by Exam 8/90/+2--Cerclage string not appreciated  FHR: 125 by doppler UCs:  Palpates moderate to strong  Prenatal labs: ABO, Rh: A/Positive/--  (08/17 0000) Antibody: Negative (08/17 0000) Rubella:  Immune RPR: Nonreactive (08/17 0000)  HBsAg: Negative (08/17 0000)  HIV: Non-reactive (08/17 0000)  GBS:  Negative Sickle cell/Hgb electrophoresis:  Normal Pap:  H/O Abnormal GC:  Negative Chlamydia:  Negative Other:  None    Assessment IUP at 37.6wks Active Labor-Transition Phase GBS Negative S/P Cerclage   Plan: Admit to [employer]  Routine Labor and Delivery Orders per [facility] Protocol Anticipate Imminent SVD Dr.AR to be updated as appropriate  Joellyn Quails, MSN 12/18/2015, 12:44 AM

## 2015-12-18 NOTE — MAU Note (Signed)
Patient brought from lobby in wheelchair directly to mau room for a labor eval. Denies LOF at this time. Contractions every1-2 minutes. +FM. Patient requesting epidural. Gerrit Heck, CNM at bedside for SVE; SVE 8cm/90%. FHR 130's. Following exam, patient taken directly to admitting room via stretcher accompanied by RN and CNM.

## 2015-12-19 NOTE — Progress Notes (Signed)
Post Partum Day 1  Subjective: no complaints, up ad lib, voiding, tolerating PO, + flatus and +BM.  Objective: Blood pressure 120/88, pulse 80, temperature 98.3 F (36.8 C), temperature source Oral, resp. rate 18, height  (1.575 m), weight 161 lb (73.029 kg), last menstrual period 03/28/2015, SpO2 97 %, unknown if currently breastfeeding.  Physical Exam:  General: alert and no distress Lochia: appropriate Uterine Fundus: firm, NT DVT Evaluation: no calf tenderness   Recent Labs  12/18/15 0120 12/18/15 0544  HGB 13.4 12.3  HCT 38.4 36.1    Assessment/Plan: Doing Well Plan for discharge tomorrow, Breastfeeding, Circumcision prior to discharge and Contraception plan IUD   LOS: 1 day   Jodi Solis Y 12/19/2015, 3:12 PM

## 2015-12-19 NOTE — Lactation Note (Signed)
This note was copied from a baby's chart. Lactation Consultation Note  Patient Name: Boy Maeleigh Buschman [id number] Date: 12/19/2015 Reason for consult: Follow-up assessment;Breast/nipple pain  Baby 39 hours old. Mom reports that she has had some nipple soreness, and mom's left nipple is slightly abraded. Mom states that she has been using the cradle position when latching the baby. Assisted mom to latch baby in cross-cradle position and baby latch deeply, suckling rhythmically with a few swallows noted. Mom reported increased comfort. Baby started to get sleepy, so undressed baby and he maintained a deep latch for 15 minutes. Enc mom to use EBM on nipples and wear comfort gels in between nursing.  Mom states that her milk dried up with both older children when she returned to work. Mom has a hx of hypothyroidism, so enc mom to discuss levels with her HCP. Discussed nursing/pumping and returning to work and gave tips for the transition. Mom aware of OP/BFSG and LC phone line assistance after D/C.  Maternal Data    Feeding Feeding Type: Breast Fed Length of feed: 15 min  LATCH Score/Interventions Latch: Grasps breast easily, tongue down, lips flanged, rhythmical sucking.  Audible Swallowing: A few with stimulation Intervention(s): Skin to skin;Hand expression  Type of Nipple: Everted at rest and after stimulation  Comfort (Breast/Nipple): Filling, red/small blisters or bruises, mild/mod discomfort  Problem noted: Mild/Moderate discomfort  Hold (Positioning): Assistance needed to correctly position infant at breast and maintain latch. Intervention(s): Breastfeeding basics reviewed;Support Pillows;Position options;Skin to skin  LATCH Score: 7  Lactation Tools Discussed/Used     Consult Status Consult Status: Follow-up Date: 12/20/15 Follow-up type: In-patient    Geralynn Ochs 12/19/2015, 3:28 PM

## 2015-12-20 MED ORDER — ACETAMINOPHEN 325 MG PO TABS: 650.0000 mg | ORAL_TABLET | ORAL | Status: AC | PRN

## 2015-12-20 MED ORDER — SENNOSIDES-DOCUSATE SODIUM 8.6-50 MG PO TABS: 2.0000 | ORAL_TABLET | Freq: Every evening | ORAL | Status: AC | PRN

## 2015-12-20 MED ORDER — IBUPROFEN 600 MG PO TABS: 600.0000 mg | ORAL_TABLET | Freq: Four times a day (QID) | ORAL | Status: AC

## 2015-12-20 NOTE — Lactation Note (Signed)
This note was copied from a baby's chart. Lactation Consultation Note  Patient Name: Jodi Solis [id number] Date: 12/20/2015 Reason for consult: Follow-up assessment Baby on the breast for short time at this visit, Mom reports BF 1 hour ago. Mom reports concern about baby having stuffy nose and getting air with nursing. Assisted Mom to keep baby closer for more depth with latch at this visit and baby demonstrated good suckling bursts, sustained the latch without popping on/off, did not appear to be stuffy once more depth was achieved.  RN plans to give baby saline drops in his nose to help with stuffiness after my visit if needed. Baby is BF often 10 times in past 24 hours, adequate output, weight loss 7%, now 56 hours old. Mom is experienced BF. Basic teaching reviewed, engorgement care discussed. Advised of OP services and support group. Encouraged to call for questions/concerns.   Maternal Data    Feeding Feeding Type: Breast Fed Length of feed: 8 min  LATCH Score/Interventions Latch: Grasps breast easily, tongue down, lips flanged, rhythmical sucking.  Audible Swallowing: A few with stimulation  Type of Nipple: Everted at rest and after stimulation  Comfort (Breast/Nipple): Soft / non-tender     Hold (Positioning): Assistance needed to correctly position infant at breast and maintain latch. Intervention(s): Breastfeeding basics reviewed;Support Pillows;Position options;Skin to skin  LATCH Score: 8  Lactation Tools Discussed/Used     Consult Status Consult Status: Complete Date: 12/20/15 Follow-up type: In-patient    Jodi Solis 12/20/2015, 9:00 AM

## 2015-12-20 NOTE — Clinical Social Work Maternal (Signed)
CLINICAL SOCIAL WORK MATERNAL/CHILD NOTE  Patient Details  Name: Jodi Solis MRN: [id number] Date of Birth: [birth year]  Date:  12/20/2015  Clinical Social Worker Initiating Note:  Elissa Hefty, MSW intern  Date/ Time Initiated:  12/20/15/0930     Child's Name:  Hewitt Blade. 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 MOB denied any prior history of any mental health concerns and shared her feelings of anxiety were situational to her current pregnancy and being on bed rest since November.    Cognitive State:  Insightful , Linear Thinking , Goal Oriented , Able to Concentrate    Mood/Affect:  Happy , Interested , Comfortable , Relaxed , Calm    CSW Assessment: MSW intern presented in patient's room due to a consult being placed by the central nursery because of a history of anxiety. FOB and MOB's mother were present in the room when MSW intern entered. MOB provided verbal consent for MSW intern to engage. MOB presented to be in a happy mood as evidence by her cleaning around the room and getting ready  to possibly be discharged today. MOB voiced that the birthing process was "easier" than she expected and it was a quick labor. MOB reported that she had two daughters ages 13 and 1 at home and both had been SVD's as well. MOB stated that this birthing process was unique because it was un medicated and she wasn't in labor as long. MOB expressed breastfeeding going well and shared that her lactation consultant has been very helpful. MOB shared that she is happy with the overall care she has received at the hospital and is transitioning well into postpartum. According to MOB, FOB and her just moved to [place] about a year ago from [place]. MOB reported that she is originally from [place] and decided to move back because they have a better family support here. MOB shared her parents and siblings live here and will be able to help with the children. 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 MOB shared she will only  be taking 6 weeks of FMLA because she has been on bed rest since November. MOB voiced she has met all of the infant's basic needs and is prepared to go home. According to MOB, several family members provided them with things for their infant because they have recently had children as well.   MSW intern inquired about MOB's mental health during the pregnancy. MOB denied any mental health concerns prior to the pregnancy but shared that because she  was on bed rest longer this pregnancy she started to feel "worried" and overwhelmed about having to be at home. MOB shared she would worry about how they were going to make ends meet while she was not working and if everything was okay with her health wise. MOB disclosed that she has been put on bed rest with all her pregnancies due to her cervix "thinning out" early on during the pregnancy. MOB shared that she has never been worried about it before and it is not  life threatening to her or the infant but felt the  anxiety this time because of the length of time she was on bed rest. MOB denied any panic attacks or other symptoms. MOB described her level of anxiety to be very mild and manageable. MOB denied having any concerns about her mental health going into postpartum.   MSW intern provided education on perinatal mood disorders and the hospital's support group, Feelings After Birth. MSW intern also left MOB with a handout of information and resources on the topic. MOB shared she has never experienced a perinatal mood disorder before but recognized that every pregnancy and postpartum experience is different. MOB acknowledged the importance of her mental health postpartum and agreed to contact her OB if needs arise. MSW intern asked MOB what were some of her favorite things to do to "unwind" from her busy day. MOB expressed that she loved to garden and spend time outdoors. MOB also shared she enjoyed sleeping. MSW intern acknowledged MOB's favorite things to do and reframed them as coping skills MOB can utilize to cope with her anxiety in the future. MOB voiced she loved to garden and found it to be "soothing and calming" for her and would continue to garden along as the weather permitted. MOB also shared that she would try to get in as much sleep as she could now that she would have three children at home. MSW intern reminded MOB the importance of taking care of herself and making sure she got enough sleep.   MOB thanked MSW intern for stopping by and showed appreciation in the information provided. MOB agreed to contact MSW intern if needs arise.   CSW Plan/Description:   Engineer, mining- MSW intern provided education on perinatal mood disorders and the hospital's support group.  No Further Intervention Required/No Barriers to Discharge    Trevor Iha, Student-SW 12/20/2015, 9:54 AM

## 2015-12-20 NOTE — Discharge Summary (Signed)
OB Discharge Summary  Patient Name: Jodi Solis DOB: 1981-09-20 MRN: [id number]  Date of admission: 12/18/2015 Delivering MD: Gerrit Heck   Date of discharge: 12/20/2015  Admitting diagnosis: LABOR Intrauterine pregnancy: [redacted]w[redacted]d     Secondary diagnosis:Principal Problem:   SVD (spontaneous vaginal delivery) Active Problems:   Labor and delivery, indication for care  Additional problems:none     Discharge diagnosis: Term Pregnancy Delivered                                                                     Post partum procedures:none  Augmentation: none  Complications: None  Hospital course:  Onset of Labor With Vaginal Delivery     35 y.o. yo [id number] at [redacted]w[redacted]d was admitted in Active Labor on 12/18/2015. Patient had an uncomplicated labor course as follows:  Membrane Rupture Time/Date: 11:10 PM ,12/17/2015   Intrapartum Procedures: Episiotomy: None [1]                                         Lacerations:  None [1]  Patient had a delivery of a Viable infant. 12/18/2015  Information for the patient's newborn:  Adelisa, Satterwhite [[id number]]  Delivery Method: Vaginal, Spontaneous Delivery (Filed from Delivery Summary)    Pateint had an uncomplicated postpartum course.  She is ambulating, tolerating a regular diet, passing flatus, and urinating well. Patient is discharged home in stable condition on 12/20/2015.    Physical exam  Filed Vitals:   12/18/15 1600 12/18/15 2008 12/19/15 0724 12/19/15 1700  BP: 123/81 119/84 120/88 118/78  Pulse: 84 90 80 91  Temp: 98.5 F (36.9 C) 98.1 F (36.7 C) 98.3 F (36.8 C) 98.8 F (37.1 C)  TempSrc: Oral Oral Oral Oral  Resp: 16 18 18 18   Height:      Weight:      SpO2:       General: alert, cooperative and no distress Lochia: appropriate Uterine Fundus: firm Incision: Healing well with no significant drainage DVT Evaluation: No evidence of DVT seen on physical exam. Labs: Lab Results  Component Value Date   WBC  22.6* 12/18/2015   HGB 12.3 12/18/2015   HCT 36.1 12/18/2015   MCV 87.4 12/18/2015   PLT 311 12/18/2015   No flowsheet data found.  Discharge instruction: per After Visit Summary and "Baby and Me Booklet".  Medications:  Current facility-administered medications:  .  acetaminophen (TYLENOL) tablet 650 mg, 650 mg, Oral, Q4H PRN, Gerrit Heck, CNM .  benzocaine-Menthol (DERMOPLAST) 20-0.5 % topical spray 1 application, 1 application, Topical, PRN, Gerrit Heck, CNM, 1 application at 12/18/15 [phone] .  witch hazel-glycerin (TUCKS) pad 1 application, 1 application, Topical, PRN **AND** dibucaine (NUPERCAINAL) 1 % rectal ointment 1 application, 1 application, Rectal, PRN, Gerrit Heck, CNM .  diphenhydrAMINE (BENADRYL) capsule 25 mg, 25 mg, Oral, Q6H PRN, Gerrit Heck, CNM .  ibuprofen (ADVIL,MOTRIN) tablet 600 mg, 600 mg, Oral, 4 times per day, Gerrit Heck, CNM, 600 mg at 12/20/15 0552 .  lanolin ointment, , Topical, PRN, Gerrit Heck, CNM .  ondansetron (ZOFRAN) tablet 4 mg, 4 mg, Oral, Q4H PRN **OR** ondansetron (  ZOFRAN) injection 4 mg, 4 mg, Intravenous, Q4H PRN, Gerrit Heck, CNM .  prenatal multivitamin tablet 1 tablet, 1 tablet, Oral, Q1200, Gerrit Heck, CNM, 1 tablet at 12/19/15 1156 .  senna-docusate (Senokot-S) tablet 2 tablet, 2 tablet, Oral, Q24H, Gerrit Heck, CNM, 2 tablet at 12/19/15 2339 .  simethicone (MYLICON) chewable tablet 80 mg, 80 mg, Oral, PRN, Gerrit Heck, CNM .  zolpidem (AMBIEN) tablet 5 mg, 5 mg, Oral, QHS PRN, Gerrit Heck, CNM After Visit Meds:    Medication List    ASK your doctor about these medications        DHA PO  Take 4 tablets by mouth daily.     metroNIDAZOLE 500 MG tablet  Commonly known as:  FLAGYL  Take 500 mg by mouth 2 (two) times daily. Patient to take  Flagyl twice a day for 7 days,did not complete first course     prenatal multivitamin Tabs tablet  Take 1 tablet by mouth at bedtime.     progesterone 100 MG capsule  Commonly known  as:  PROMETRIUM  Place 100 mg vaginally at bedtime.     Vitamin D3 5000 units Tabs  Take 1 tablet by mouth daily.        Diet: routine diet  Activity: Advance as tolerated. Pelvic rest for 6 weeks.   Outpatient follow up:6 weeks Follow up Appt:No future appointments. Follow up visit: No Follow-up on file.  Postpartum contraception: IUD unsure  Newborn Data: Live born female  Birth Weight: 7 lb 6.3 oz (3355 g) APGAR: 8, 9  Baby Feeding: Breast Disposition:home with mother   12/20/2015 Bayler Nehring, CNM      Postpartum Care After Vaginal Delivery  After you deliver your newborn (postpartum period), the usual stay in the hospital is 24 72 hours. If there were problems with your labor or delivery, or if you have other medical problems, you might be in the hospital longer.  While you are in the hospital, you will receive help and instructions on how to care for yourself and your newborn during the postpartum period.  While you are in the hospital:  Be sure to tell your nurses if you have pain or discomfort, as well as where you feel the pain and what makes the pain worse.  If you had an incision made near your vagina (episiotomy) or if you had some tearing during delivery, the nurses may put ice packs on your episiotomy or tear. The ice packs may help to reduce the pain and swelling.  If you are breastfeeding, you may feel uncomfortable contractions of your uterus for a couple of weeks. This is normal. The contractions help your uterus get back to normal size.  It is normal to have some bleeding after delivery.  For the first 1 3 days after delivery, the flow is red and the amount may be similar to a period.  It is common for the flow to start and stop.  In the first few days, you may pass some small clots. Let your nurses know if you begin to pass large clots or your flow increases.  Do not  flush blood clots down the toilet before having the nurse look at  them.  During the next 3 10 days after delivery, your flow should become more watery and pink or brown-tinged in color.  Ten to fourteen days after delivery, your flow should be a small amount of yellowish-white discharge.  The amount of your flow will decrease over the first  few weeks after delivery. Your flow may stop in 6 8 weeks. Most women have had their flow stop by 12 weeks after delivery.  You should change your sanitary pads frequently.  Wash your hands thoroughly with soap and water for at least 20 seconds after changing pads, using the toilet, or before holding or feeding your newborn.  You should feel like you need to empty your bladder within the first 6 8 hours after delivery.  In case you become weak, lightheaded, or faint, call your nurse before you get out of bed for the first time and before you take a shower for the first time.  Within the first few days after delivery, your breasts may begin to feel tender and full. This is called engorgement. Breast tenderness usually goes away within 48 72 hours after engorgement occurs. You may also notice milk leaking from your breasts. If you are not breastfeeding, do not stimulate your breasts. Breast stimulation can make your breasts produce more milk.  Spending as much time as possible with your newborn is very important. During this time, you and your newborn can feel close and get to know each other. Having your newborn stay in your room (rooming in) will help to strengthen the bond with your newborn. It will give you time to get to know your newborn and become comfortable caring for your newborn.  Your hormones change after delivery. Sometimes the hormone changes can temporarily cause you to feel sad or tearful. These feelings should not last more than a few days. If these feelings last longer than that, you should talk to your caregiver.  If desired, talk to your caregiver about methods of family planning or  contraception.  Talk to your caregiver about immunizations. Your caregiver may want you to have the following immunizations before leaving the hospital:  Tetanus, diphtheria, and pertussis (Tdap) or tetanus and diphtheria (Td) immunization. It is very important that you and your family (including grandparents) or others caring for your newborn are up-to-date with the Tdap or Td immunizations. The Tdap or Td immunization can help protect your newborn from getting ill.  Rubella immunization.  Varicella (chickenpox) immunization.  Influenza immunization. You should receive this annual immunization if you did not receive the immunization during your pregnancy. Document Released: 08/11/2007 Document Revised: 07/08/2012 Document Reviewed: 06/10/2012 [facility] Patient Information 2014 [place], [place].   Postpartum Depression and Baby Blues  The postpartum period begins right after the birth of a baby. During this time, there is often a great amount of joy and excitement. It is also a time of considerable changes in the life of the parent(s). Regardless of how many times a mother gives birth, each child brings new challenges and dynamics to the family. It is not unusual to have feelings of excitement accompanied by confusing shifts in moods, emotions, and thoughts. All mothers are at risk of developing postpartum depression or the "baby blues." These mood changes can occur right after giving birth, or they may occur many months after giving birth. The baby blues or postpartum depression can be mild or severe. Additionally, postpartum depression can resolve rather quickly, or it can be a long-term condition. CAUSES Elevated hormones and their rapid decline are thought to be a main cause of postpartum depression and the baby blues. There are a number of hormones that radically change during and after pregnancy. Estrogen and progesterone usually decrease immediately after delivering your baby. The level of  thyroid hormone and various cortisol steroids also rapidly  drop. Other factors that play a major role in these changes include major life events and genetics.  RISK FACTORS If you have any of the following risks for the baby blues or postpartum depression, know what symptoms to watch out for during the postpartum period. Risk factors that may increase the likelihood of getting the baby blues or postpartum depression include: 1. Havinga personal or family history of depression. 2. Having depression while being pregnant. 3. Having premenstrual or oral contraceptive-associated mood issues. 4. Having exceptional life stress. 5. Having marital conflict. 6. Lacking a social support network. 7. Having a baby with special needs. 8. Having health problems such as diabetes. SYMPTOMS Baby blues symptoms include:  Brief fluctuations in mood, such as going from extreme happiness to sadness.  Decreased concentration.  Difficulty sleeping.  Crying spells, tearfulness.  Irritability.  Anxiety. Postpartum depression symptoms typically begin within the first month after giving birth. These symptoms include:  Difficulty sleeping or excessive sleepiness.  Marked weight loss.  Agitation.  Feelings of worthlessness.  Lack of interest in activity or food. Postpartum psychosis is a very concerning condition and can be dangerous. Fortunately, it is rare. Displaying any of the following symptoms is cause for immediate medical attention. Postpartum psychosis symptoms include:  Hallucinations and delusions.  Bizarre or disorganized behavior.  Confusion or disorientation. DIAGNOSIS  A diagnosis is made by an evaluation of your symptoms. There are no medical or lab tests that lead to a diagnosis, but there are various questionnaires that a caregiver may use to identify those with the baby blues, postpartum depression, or psychosis. Often times, a screening tool called the [place] Postnatal  Depression Scale is used to diagnose depression in the postpartum period.  TREATMENT The baby blues usually goes away on its own in 1 to 2 weeks. Social support is often all that is needed. You should be encouraged to get adequate sleep and rest. Occasionally, you may be given medicines to help you sleep.  Postpartum depression requires treatment as it can last several months or longer if it is not treated. Treatment may include individual or group therapy, medicine, or both to address any social, physiological, and psychological factors that may play a role in the depression. Regular exercise, a healthy diet, rest, and social support may also be strongly recommended.  Postpartum psychosis is more serious and needs treatment right away. Hospitalization is often needed. HOME CARE INSTRUCTIONS  Get as much rest as you can. Nap when the baby sleeps.  Exercise regularly. Some women find yoga and walking to be beneficial.  Eat a balanced and nourishing diet.  Do little things that you enjoy. Have a cup of tea, take a bubble bath, read your favorite magazine, or listen to your favorite music.  Avoid alcohol.  Ask for help with household chores, cooking, grocery shopping, or running errands as needed. Do not try to do everything.  Talk to people close to you about how you are feeling. Get support from your partner, family members, friends, or other new moms.  Try to stay positive in how you think. Think about the things you are grateful for.  Do not spend a lot of time alone.  Only take medicine as directed by your caregiver.  Keep all your postpartum appointments.  Let your caregiver know if you have any concerns. SEEK MEDICAL CARE IF: You are having a reaction or problems with your medicine. SEEK IMMEDIATE MEDICAL CARE IF:  You have suicidal feelings.  You feel you may  harm the baby or someone else. Document Released: 07/18/2004 Document Revised: 01/06/2012 Document Reviewed:  08/20/2011 [facility] Patient Information 2014 [place], [place].     Breastfeeding Deciding to breastfeed is one of the best choices you can make for you and your baby. A change in hormones during pregnancy causes your breast tissue to grow and increases the number and size of your milk ducts. These hormones also allow proteins, sugars, and fats from your blood supply to make breast milk in your milk-producing glands. Hormones prevent breast milk from being released before your baby is born as well as prompt milk flow after birth. Once breastfeeding has begun, thoughts of your baby, as well as his or her sucking or crying, can stimulate the release of milk from your milk-producing glands.  BENEFITS OF BREASTFEEDING For Your Baby  Your first milk (colostrum) helps your baby's digestive system function better.   There are antibodies in your milk that help your baby fight off infections.   Your baby has a lower incidence of asthma, allergies, and sudden infant death syndrome.   The nutrients in breast milk are better for your baby than infant formulas and are designed uniquely for your baby's needs.   Breast milk improves your baby's brain development.   Your baby is less likely to develop other conditions, such as childhood obesity, asthma, or type 2 diabetes mellitus.  For You   Breastfeeding helps to create a very special bond between you and your baby.   Breastfeeding is convenient. Breast milk is always available at the correct temperature and costs nothing.   Breastfeeding helps to burn calories and helps you lose the weight gained during pregnancy.   Breastfeeding makes your uterus contract to its prepregnancy size faster and slows bleeding (lochia) after you give birth.   Breastfeeding helps to lower your risk of developing type 2 diabetes mellitus, osteoporosis, and breast or ovarian cancer later in life. SIGNS THAT YOUR BABY IS HUNGRY Early Signs of  Hunger  Increased alertness or activity.  Stretching.  Movement of the head from side to side.  Movement of the head and opening of the mouth when the corner of the mouth or cheek is stroked (rooting).  Increased sucking sounds, smacking lips, cooing, sighing, or squeaking.  Hand-to-mouth movements.  Increased sucking of fingers or hands. Late Signs of Hunger  Fussing.  Intermittent crying. Extreme Signs of Hunger Signs of extreme hunger will require calming and consoling before your baby will be able to breastfeed successfully. Do not wait for the following signs of extreme hunger to occur before you initiate breastfeeding:   Restlessness.  A loud, strong cry.   Screaming.   BREASTFEEDING BASICS Breastfeeding Initiation  Find a comfortable place to sit or lie down, with your neck and back well supported.  Place a pillow or rolled up blanket under your baby to bring him or her to the level of your breast (if you are seated). Nursing pillows are specially designed to help support your arms and your baby while you breastfeed.  Make sure that your baby's abdomen is facing your abdomen.   Gently massage your breast. With your fingertips, massage from your chest wall toward your nipple in a circular motion. This encourages milk flow. You may need to continue this action during the feeding if your milk flows slowly.  Support your breast with 4 fingers underneath and your thumb above your nipple. Make sure your fingers are well away from your nipple and your baby's mouth.  Stroke your baby's lips gently with your finger or nipple.   When your baby's mouth is open wide enough, quickly bring your baby to your breast, placing your entire nipple and as much of the colored area around your nipple (areola) as possible into your baby's mouth.   More areola should be visible above your baby's upper lip than below the lower lip.   Your baby's tongue should be between his or  her lower gum and your breast.   Ensure that your baby's mouth is correctly positioned around your nipple (latched). Your baby's lips should create a seal on your breast and be turned out (everted).  It is common for your baby to suck about 2-3 minutes in order to start the flow of breast milk. Latching Teaching your baby how to latch on to your breast properly is very important. An improper latch can cause nipple pain and decreased milk supply for you and poor weight gain in your baby. Also, if your baby is not latched onto your nipple properly, he or she may swallow some air during feeding. This can make your baby fussy. Burping your baby when you switch breasts during the feeding can help to get rid of the air. However, teaching your baby to latch on properly is still the best way to prevent fussiness from swallowing air while breastfeeding. Signs that your baby has successfully latched on to your nipple:    Silent tugging or silent sucking, without causing you pain.   Swallowing heard between every 3-4 sucks.    Muscle movement above and in front of his or her ears while sucking.  Signs that your baby has not successfully latched on to nipple:   Sucking sounds or smacking sounds from your baby while breastfeeding.  Nipple pain. If you think your baby has not latched on correctly, slip your finger into the corner of your baby's mouth to break the suction and place it between your baby's gums. Attempt breastfeeding initiation again. Signs of Successful Breastfeeding Signs from your baby:   A gradual decrease in the number of sucks or complete cessation of sucking.   Falling asleep.   Relaxation of his or her body.   Retention of a small amount of milk in his or her mouth.   Letting go of your breast by himself or herself. Signs from you:  Breasts that have increased in firmness, weight, and size 1-3 hours after feeding.   Breasts that are softer immediately after  breastfeeding.  Increased milk volume, as well as a change in milk consistency and color by the fifth day of breastfeeding.   Nipples that are not sore, cracked, or bleeding. Signs That Your Pecola Leisure is Getting Enough Milk  Wetting at least 3 diapers in a 24-hour period. The urine should be clear and pale yellow by age 30 days.  At least 3 stools in a 24-hour period by age 30 days. The stool should be soft and yellow.  At least 3 stools in a 24-hour period by age 372 days. The stool should be seedy and yellow.  No loss of weight greater than 10% of birth weight during the first 63 days of age.  Average weight gain of 4-7 ounces (113-198 g) per week after age 21 days.  Consistent daily weight gain by age 30 days, without weight loss after the age of 2 weeks. After a feeding, your baby may spit up a small amount. This is common. BREASTFEEDING FREQUENCY AND DURATION Frequent feeding will  help you make more milk and can prevent sore nipples and breast engorgement. Breastfeed when you feel the need to reduce the fullness of your breasts or when your baby shows signs of hunger. This is called "breastfeeding on demand." Avoid introducing a pacifier to your baby while you are working to establish breastfeeding (the first 4-6 weeks after your baby is born). After this time you may choose to use a pacifier. Research has shown that pacifier use during the first year of a baby's life decreases the risk of sudden infant death syndrome (SIDS). Allow your baby to feed on each breast as long as he or she wants. Breastfeed until your baby is finished feeding. When your baby unlatches or falls asleep while feeding from the first breast, offer the second breast. Because newborns are often sleepy in the first few weeks of life, you may need to awaken your baby to get him or her to feed. Breastfeeding times will vary from baby to baby. However, the following rules can serve as a guide to help you ensure that your baby is  properly fed:  Newborns (babies 7 weeks of age or younger) may breastfeed every 1-3 hours.  Newborns should not go longer than 3 hours during the day or 5 hours during the night without breastfeeding.  You should breastfeed your baby a minimum of 8 times in a 24-hour period until you begin to introduce solid foods to your baby at around 68 months of age. BREAST MILK PUMPING Pumping and storing breast milk allows you to ensure that your baby is exclusively fed your breast milk, even at times when you are unable to breastfeed. This is especially important if you are going back to work while you are still breastfeeding or when you are not able to be present during feedings. Your lactation consultant can give you guidelines on how long it is safe to store breast milk.  A breast pump is a machine that allows you to pump milk from your breast into a sterile bottle. The pumped breast milk can then be stored in a refrigerator or freezer. Some breast pumps are operated by hand, while others use electricity. Ask your lactation consultant which type will work best for you. Breast pumps can be purchased, but some hospitals and breastfeeding support groups lease breast pumps on a monthly basis. A lactation consultant can teach you how to hand express breast milk, if you prefer not to use a pump.  CARING FOR YOUR BREASTS WHILE YOU BREASTFEED Nipples can become dry, cracked, and sore while breastfeeding. The following recommendations can help keep your breasts moisturized and healthy:  Avoid using soap on your nipples.   Wear a supportive bra. Although not required, special nursing bras and tank tops are designed to allow access to your breasts for breastfeeding without taking off your entire bra or top. Avoid wearing underwire-style bras or extremely tight bras.  Air dry your nipples for 3-39minutes after each feeding.   Use only cotton bra pads to absorb leaked breast milk. Leaking of breast milk between  feedings is normal.   Use lanolin on your nipples after breastfeeding. Lanolin helps to maintain your skin's normal moisture barrier. If you use pure lanolin, you do not need to wash it off before feeding your baby again. Pure lanolin is not toxic to your baby. You may also hand express a few drops of breast milk and gently massage that milk into your nipples and allow the milk to air dry. In  the first few weeks after giving birth, some women experience extremely full breasts (engorgement). Engorgement can make your breasts feel heavy, warm, and tender to the touch. Engorgement peaks within 3-5 days after you give birth. The following recommendations can help ease engorgement:  Completely empty your breasts while breastfeeding or pumping. You may want to start by applying warm, moist heat (in the shower or with warm water-soaked hand towels) just before feeding or pumping. This increases circulation and helps the milk flow. If your baby does not completely empty your breasts while breastfeeding, pump any extra milk after he or she is finished.  Wear a snug bra (nursing or regular) or tank top for 1-2 days to signal your body to slightly decrease milk production.  Apply ice packs to your breasts, unless this is too uncomfortable for you.  Make sure that your baby is latched on and positioned properly while breastfeeding. If engorgement persists after 48 hours of following these recommendations, contact your health care provider or a Advertising copywriter. OVERALL HEALTH CARE RECOMMENDATIONS WHILE BREASTFEEDING  Eat healthy foods. Alternate between meals and snacks, eating 3 of each per day. Because what you eat affects your breast milk, some of the foods may make your baby more irritable than usual. Avoid eating these foods if you are sure that they are negatively affecting your baby.  Drink milk, fruit juice, and water to satisfy your thirst (about 10 glasses a day).   Rest often, relax, and  continue to take your prenatal vitamins to prevent fatigue, stress, and anemia.  Continue breast self-awareness checks.  Avoid chewing and smoking tobacco.  Avoid alcohol and drug use. Some medicines that may be harmful to your baby can pass through breast milk. It is important to ask your health care provider before taking any medicine, including all over-the-counter and prescription medicine as well as vitamin and herbal supplements. It is possible to become pregnant while breastfeeding. If birth control is desired, ask your health care provider about options that will be safe for your baby. SEEK MEDICAL CARE IF:   You feel like you want to stop breastfeeding or have become frustrated with breastfeeding.  You have painful breasts or nipples.  Your nipples are cracked or bleeding.  Your breasts are red, tender, or warm.  You have a swollen area on either breast.  You have a fever or chills.  You have nausea or vomiting.  You have drainage other than breast milk from your nipples.  Your breasts do not become full before feedings by the fifth day after you give birth.  You feel sad and depressed.  Your baby is too sleepy to eat well.  Your baby is having trouble sleeping.   Your baby is wetting less than 3 diapers in a 24-hour period.  Your baby has less than 3 stools in a 24-hour period.  Your baby's skin or the white part of his or her eyes becomes yellow.   Your baby is not gaining weight by 26 days of age. SEEK IMMEDIATE MEDICAL CARE IF:   Your baby is overly tired (lethargic) and does not want to wake up and feed.  Your baby develops an unexplained fever. Document Released: 10/14/2005 Document Revised: 10/19/2013 Document Reviewed: 04/07/2013 [facility] Patient Information 2015 [place], [place]. This information is not intended to replace advice given to you by your health care provider. Make sure you discuss any questions you have with your health care provider.

## 2017-01-09 IMAGING — US US OB COMP LESS 14 WK
1 series · 14 of 28 positions shown · non-contrast
Comparison: None.

CLINICAL DATA: Patient with spotting and cramping.

EXAM:
OBSTETRIC <14 WK US AND TRANSVAGINAL OB US
TECHNIQUE: Both transabdominal and transvaginal ultrasound examinations were
performed for complete evaluation of the gestation as well as the
maternal uterus, adnexal regions, and pelvic cul-de-sac.
Transvaginal technique was performed to assess early pregnancy.

[Series 1: us ob comp less 14 wk · 14 of 38 slices shown]
[im 2/38]
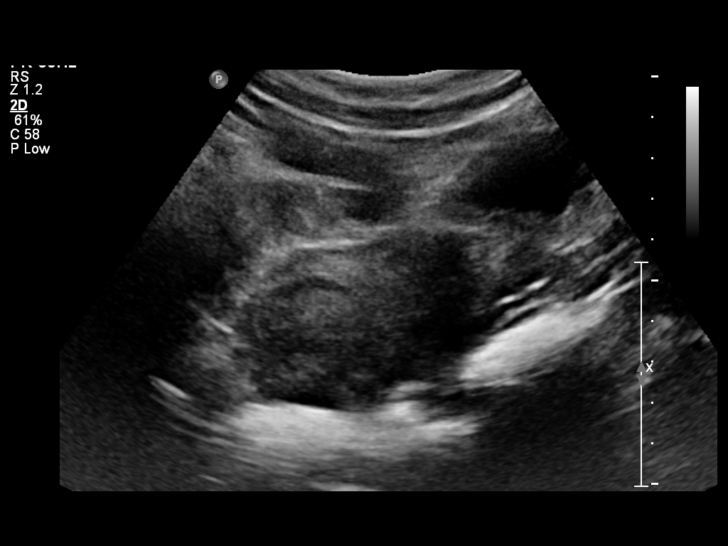
[im 5/38]
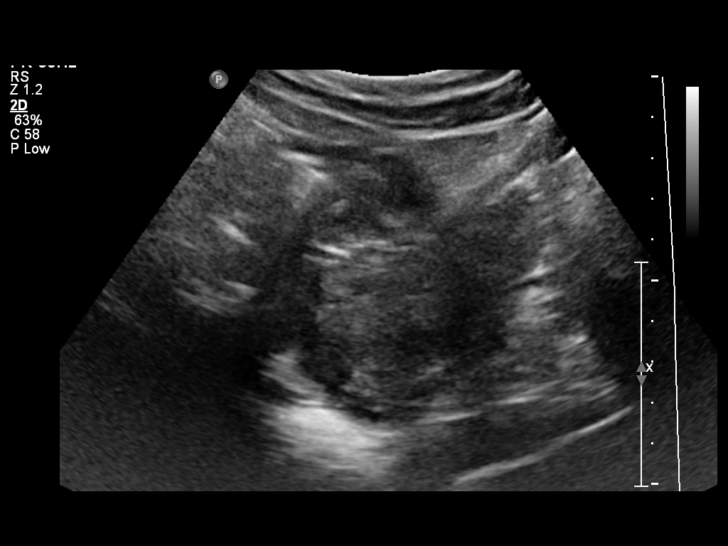
[im 7/38]
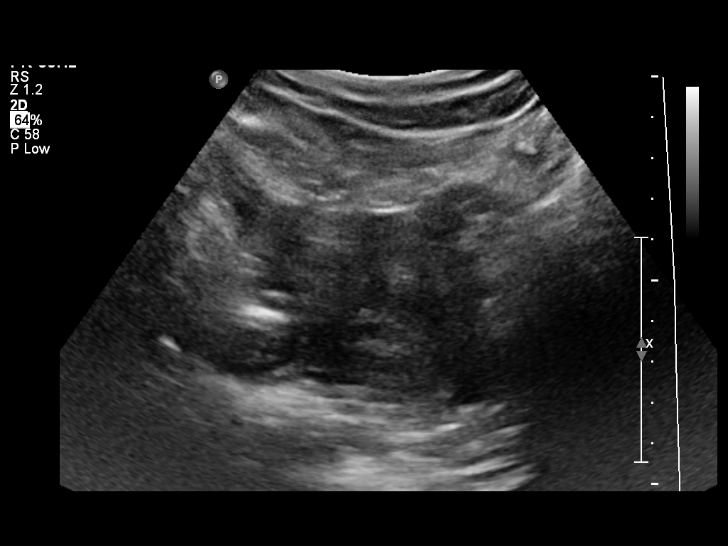
[im 10/38]
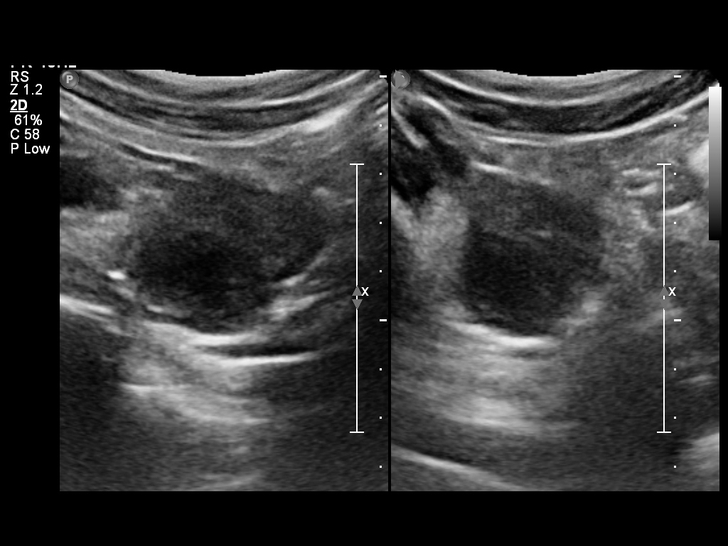
[im 13/38]
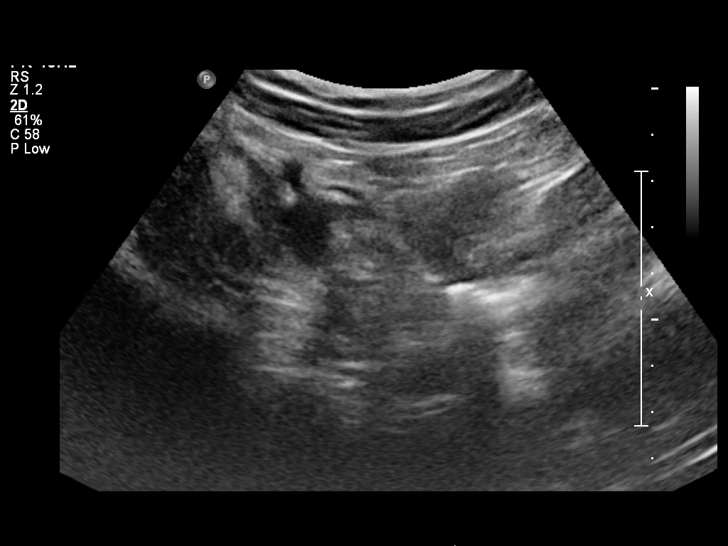
[im 16/38]
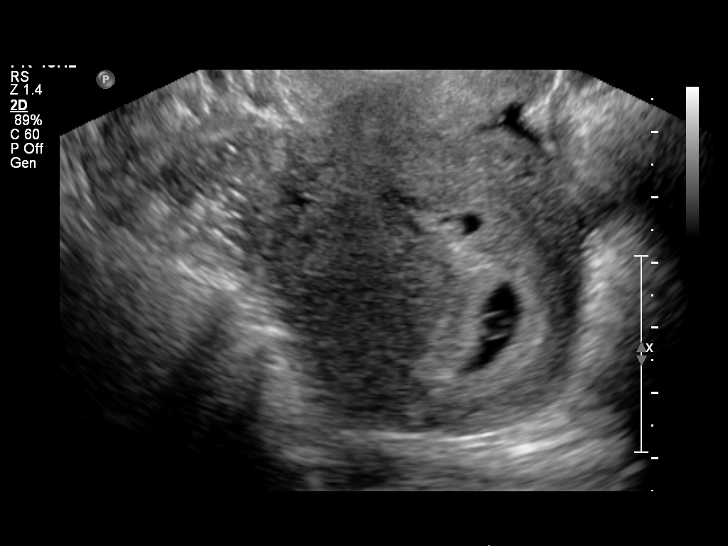
[im 18/38]
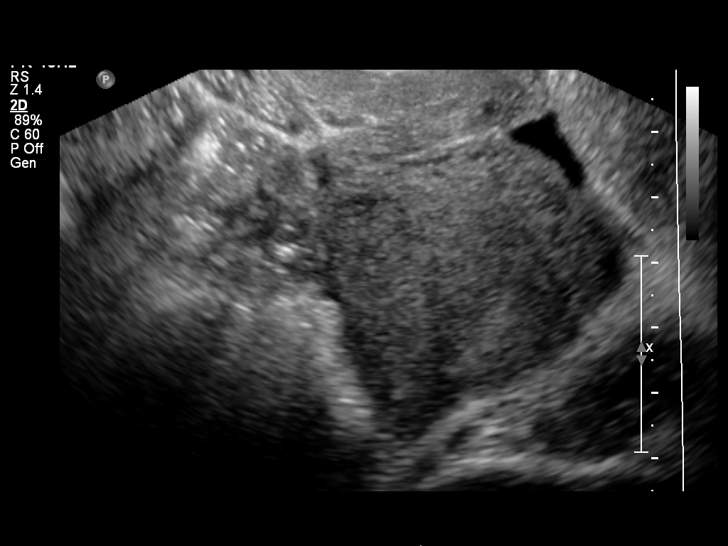
[im 21/38]
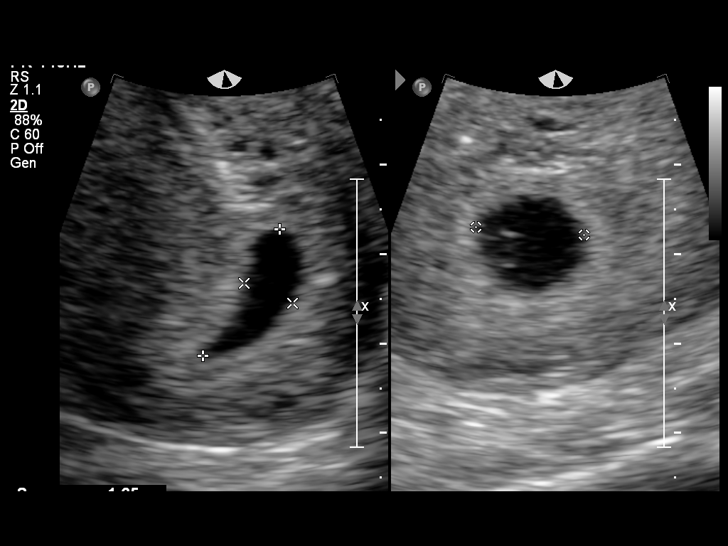
[im 24/38]
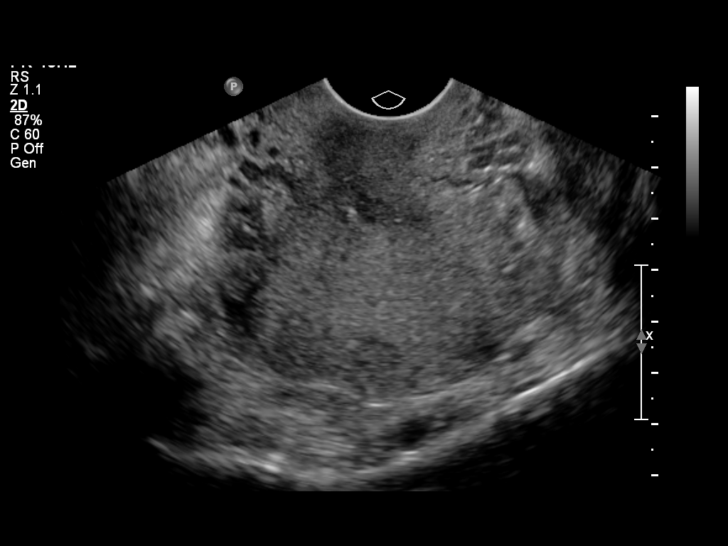
[im 27/38]
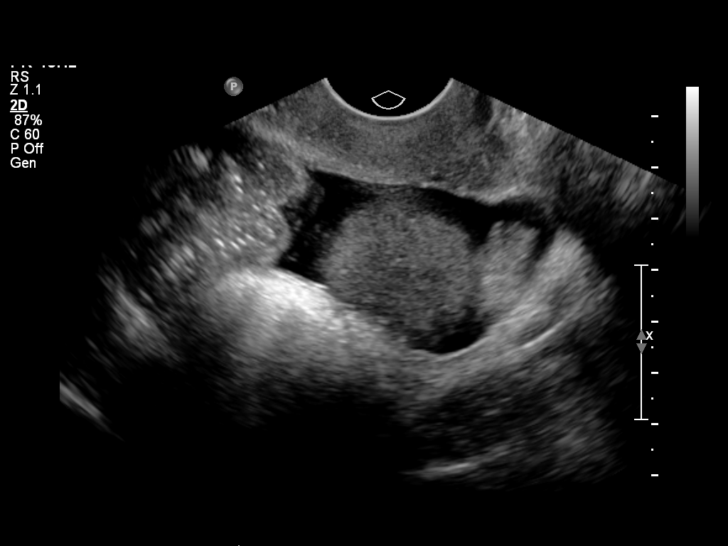
[im 29/38]
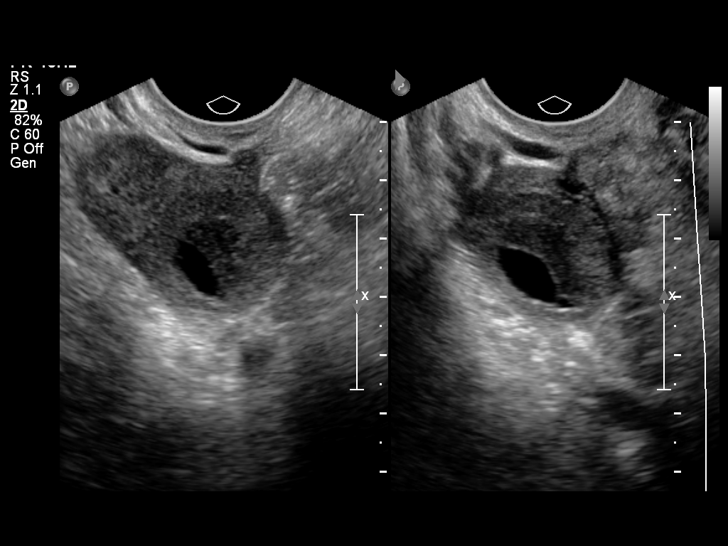
[im 32/38]
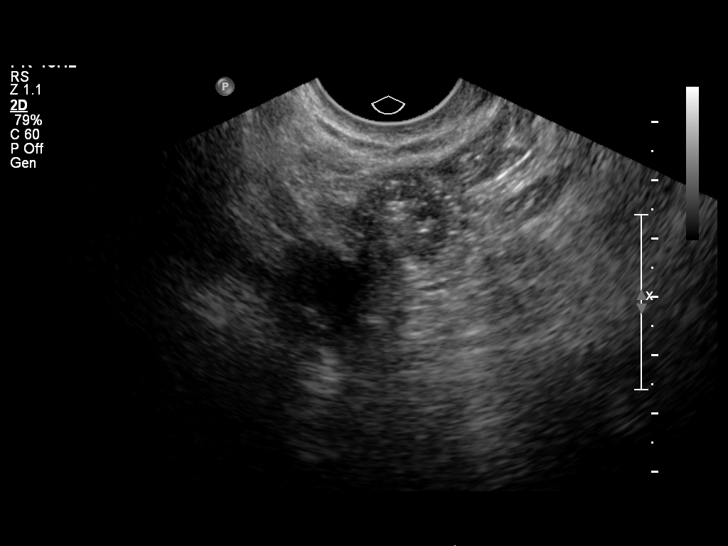
[im 35/38]
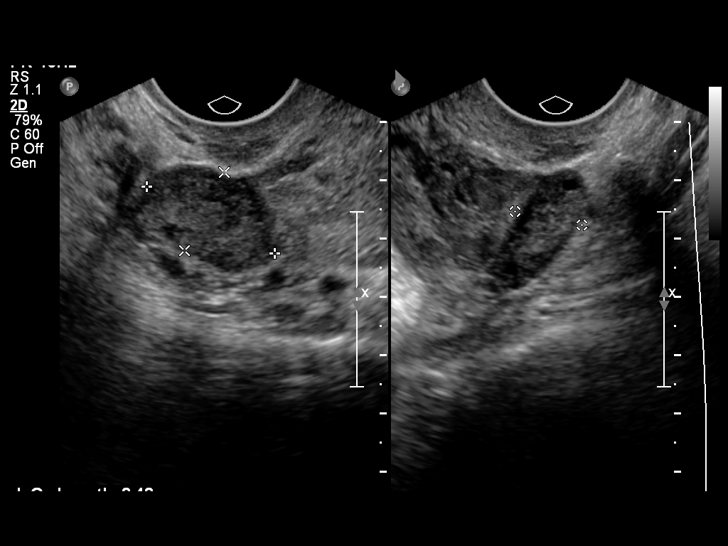
[im 38/38]
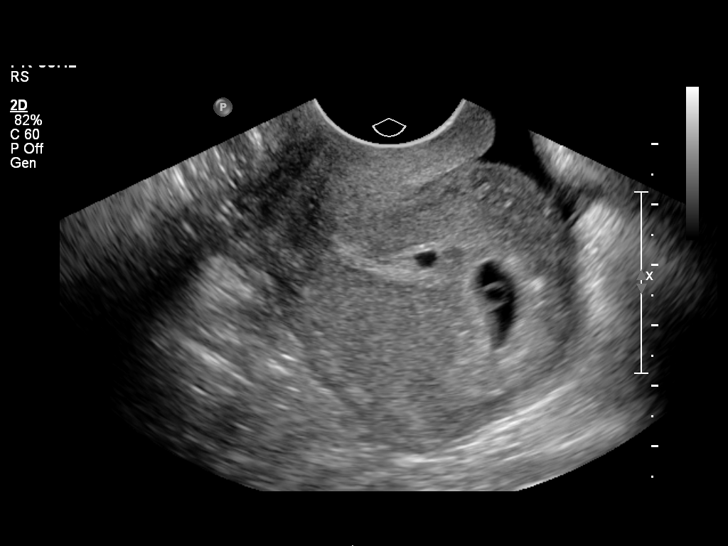

[14 of 28 positions shown; findings below may reference images not displayed]

FINDINGS: Intrauterine gestational sac: Visualized/normal in shape.

Yolk sac:  Present

Embryo:  Not present

Cardiac Activity: Not present

MSD: 11.5  mm   5 w   6  d

Maternal uterus/adnexae: Normal right left ovaries. No subchorionic
hemorrhage. Small amount of free fluid in the pelvis.
IMPRESSION: Probable early intrauterine gestational sac and yolk sac with no
fetal pole or cardiac activity yet visualized. Recommend follow-up
quantitative B-HCG levels and follow-up US in 14 days to confirm and
assess viability. This recommendation follows SRU consensus
guidelines: Diagnostic Criteria for Nonviable Pregnancy Early in the
First Trimester. N Engl J Med 4297; [DATE].

## 2017-05-26 IMAGING — US US MFM OB TRANSVAGINAL
1 series · 15 of 16 positions shown · non-contrast
Comparison: none

[Series 1: us mfm ob transvaginal · 16 acquisitions, 15 frames shown]
[im 1/16]
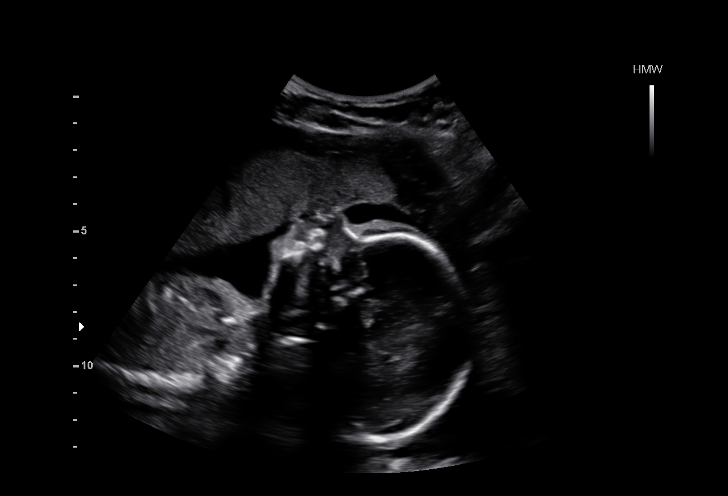
[im 2/16]
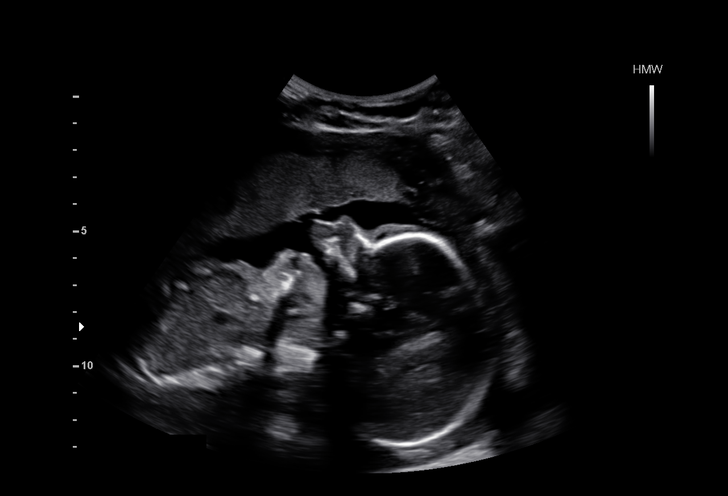
[im 3/16]
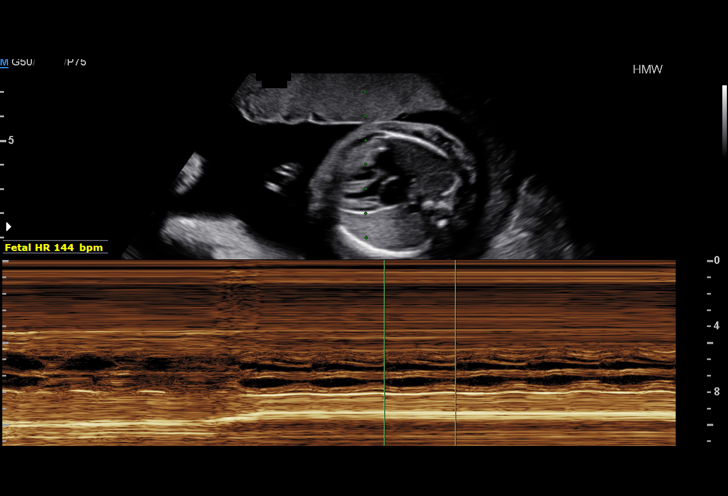
[im 4/16]
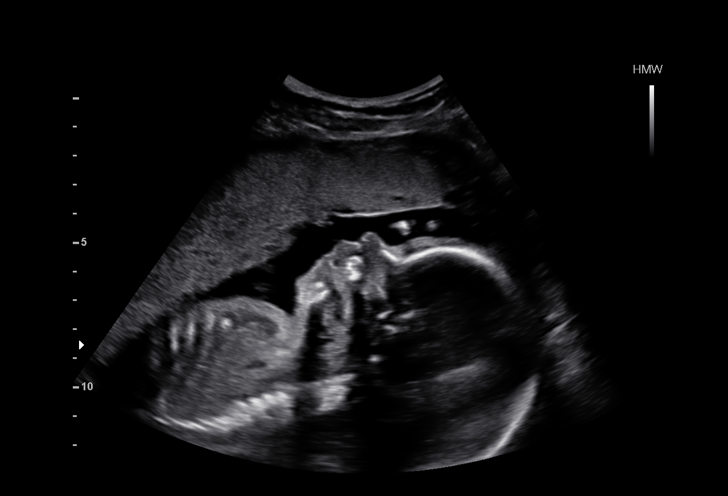
[im 5/16]
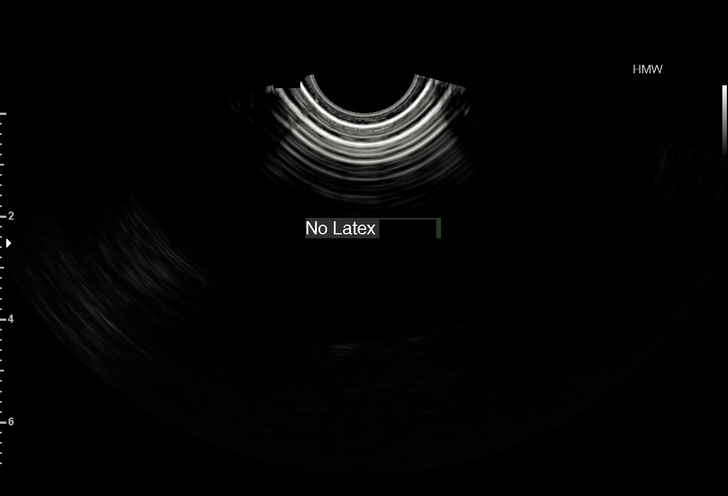
[im 6/16]
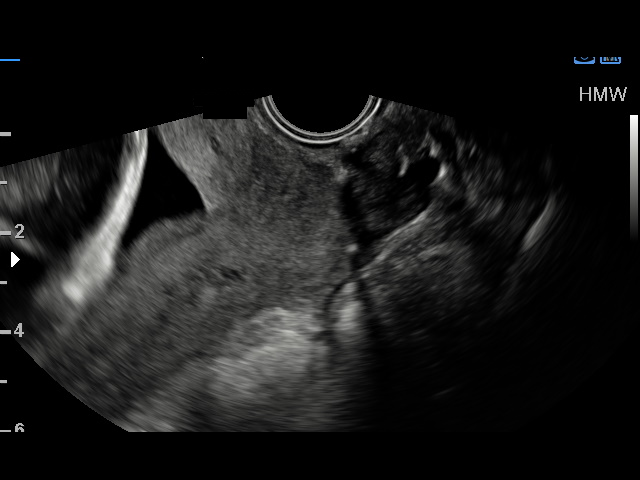
[im 7/16]
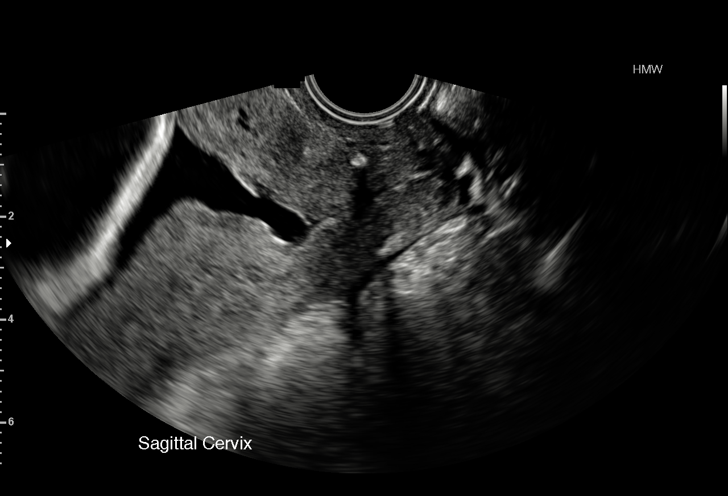
[im 9/16]
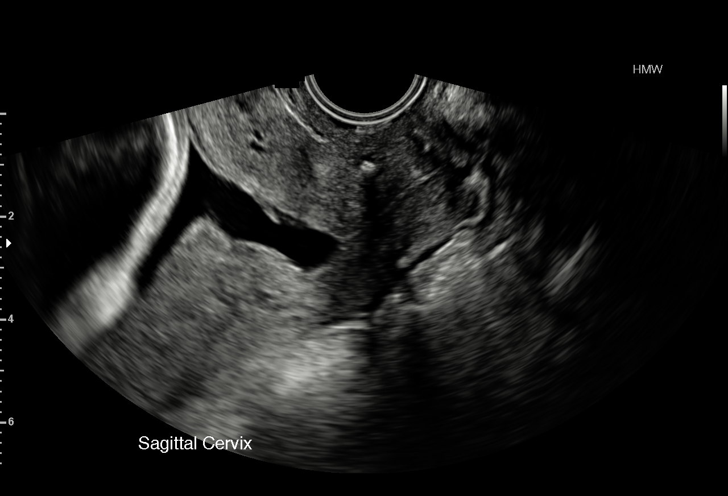
[im 10/16]
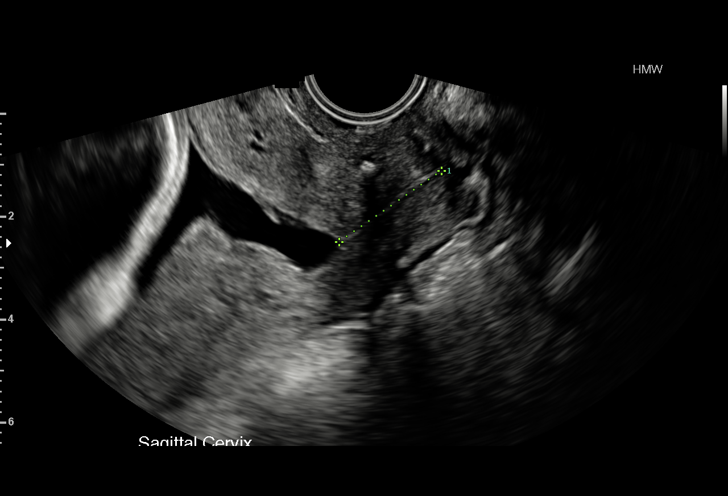
[im 11/16]
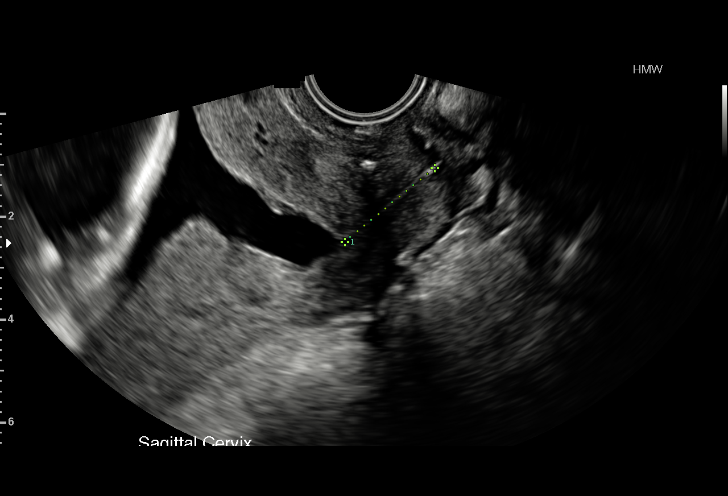
[im 12/16]
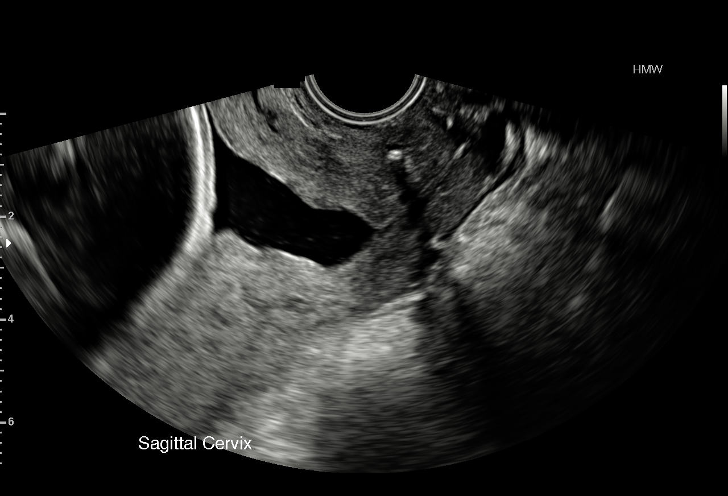
[im 13/16]
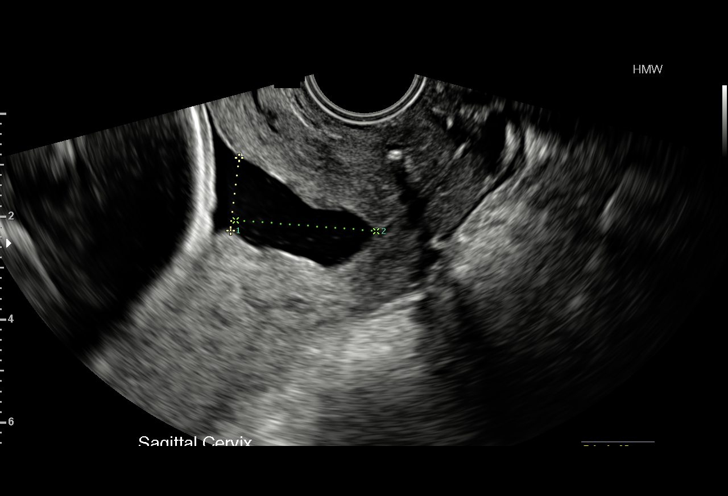
[im 14/16]
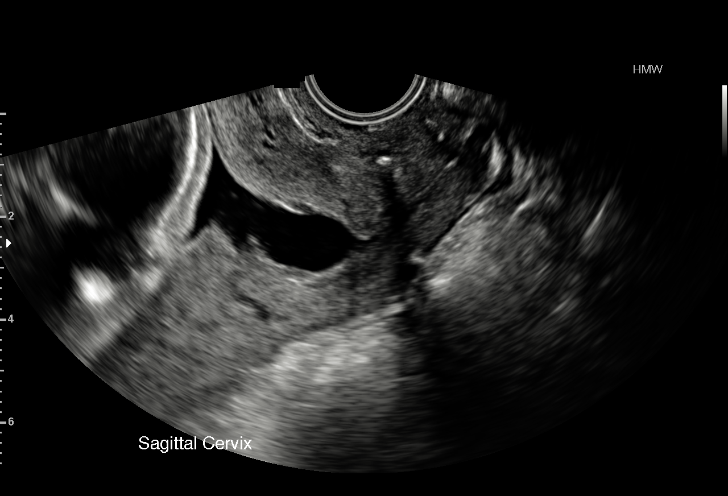
[im 15/16]
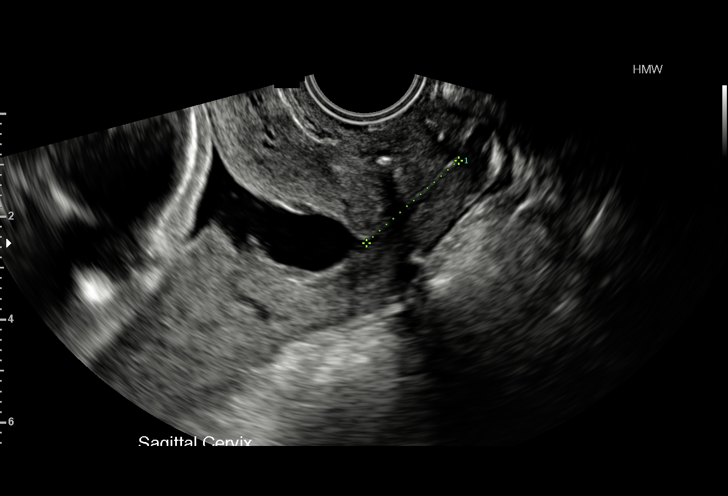
[im 16/16]
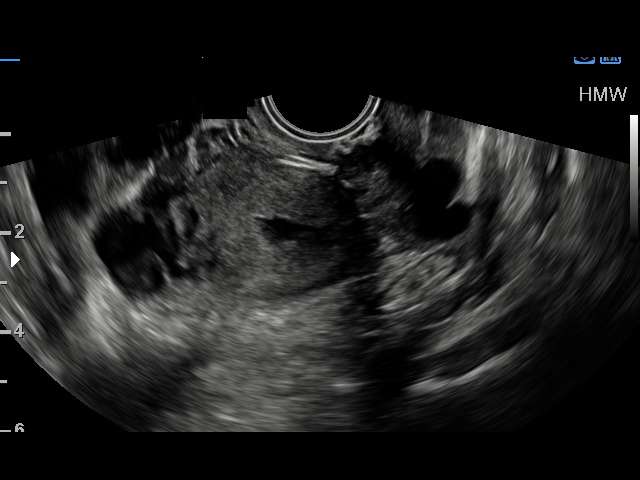

[15 of 16 positions shown; findings below may reference images not displayed]

OBSTETRICS REPORT
(Signed Final 09/20/2015 [DATE])

Name:       DARIAN BLU                        Visit  09/20/2015 [DATE]
Date:

Service(s) Provided

US MFM OB TRANSVAGINAL                                 76817.2
Indications

25 weeks gestation of pregnancy
Cervical insufficiency, 2nd
Cervical cerclage suture present, second
trimester
Fetal Evaluation

Num Of             1
Fetuses:
Fetal Heart        144                          bpm
Rate:
Cardiac Activity:  Observed
Presentation:      Cephalic
Placenta:          Anterior, above cervical
os

Amniotic Fluid
AFI FV:      Subjectively within normal limits
Gestational Age

LMP:           25w 1d        Date:  03/28/15                  EDD:   01/02/16
Best:          25w 1d    Det. By:   LMP  (03/28/15)           EDD:   01/02/16
Cervix Uterus Adnexa

Cervical Length:    2.4       cm

Cervix:       Normal appearance by transvaginal scan
Impression

Single IUP at 25w 1d
Cervical insufficiency s/p cerclage
Limited ultrasound performed for cervical length
TVUS - cervical length 2.4 cm.  V-shaped funneling noted
Recommendations

Given cervical funneling, recommend a course of
Yohanna - first dose was given today
See separate consult note
Follow up ultrasounds as clinically indicated

## 2017-07-19 IMAGING — US US MFM OB TRANSVAGINAL
1 series · 15 of 28 positions shown · non-contrast
Comparison: none

[Series 1: us mfm ob transvaginal · 15 of 34 slices shown]
[im 1/34]
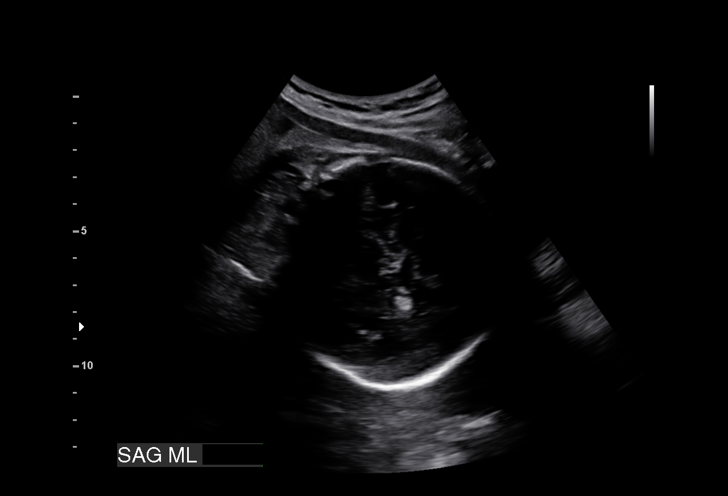
[im 3/34]
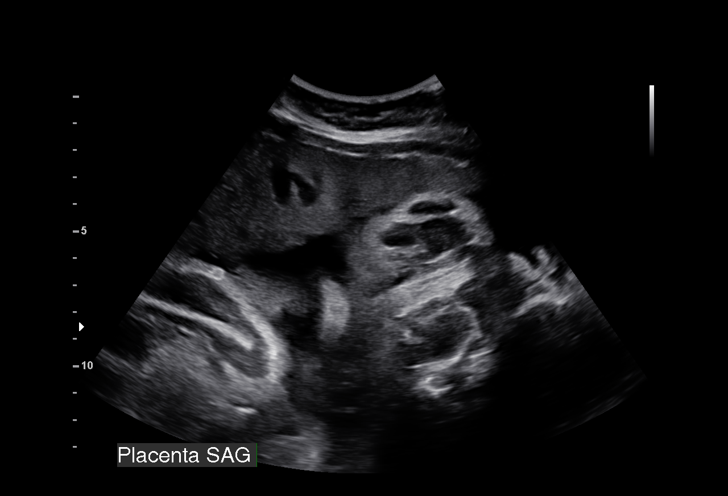
[im 5/34]
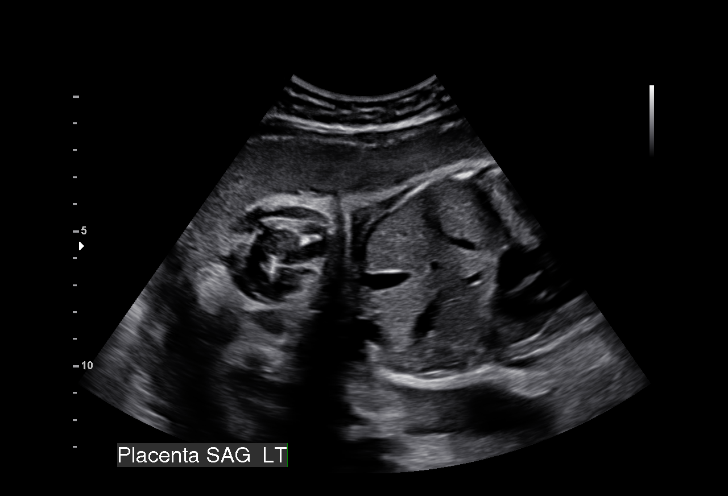
[im 8/34]
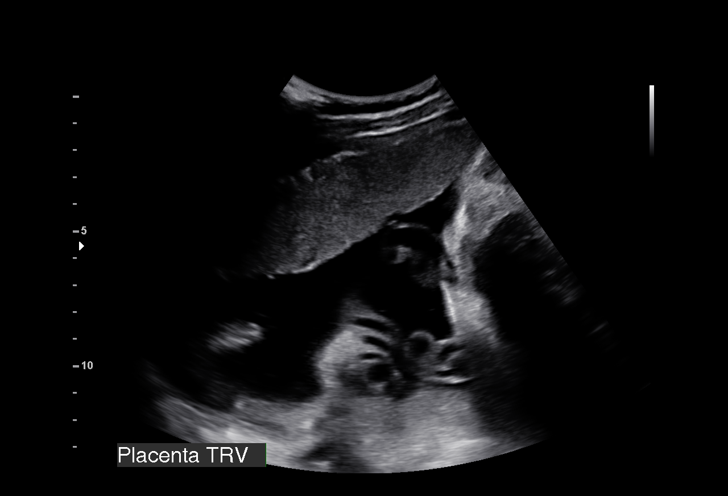
[im 10/34]
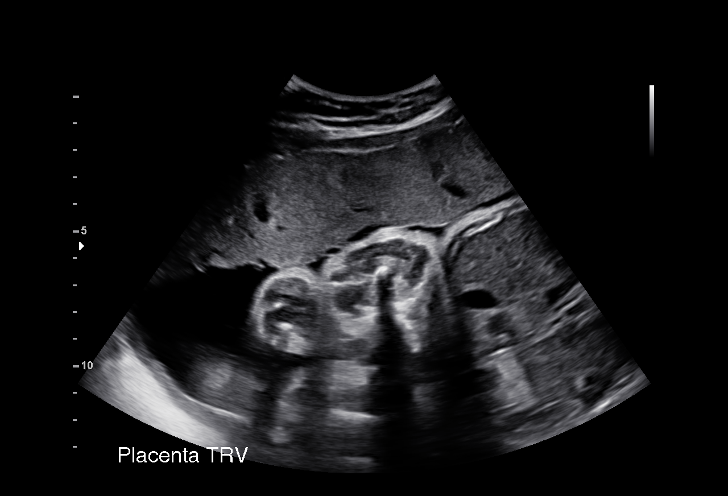
[im 13/34]
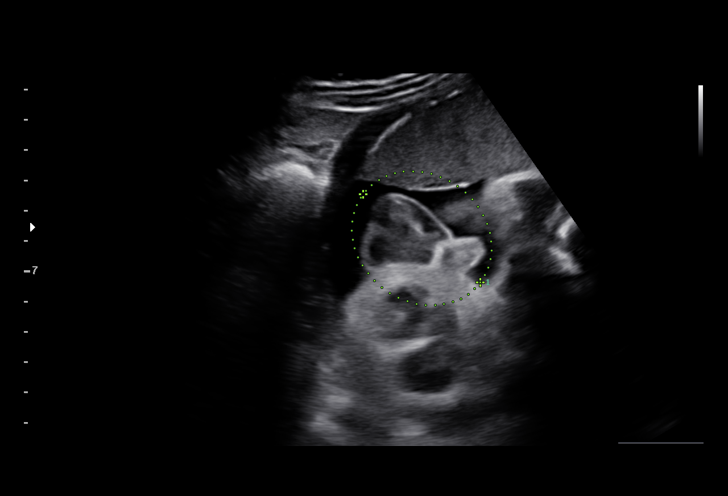
[im 15/34]
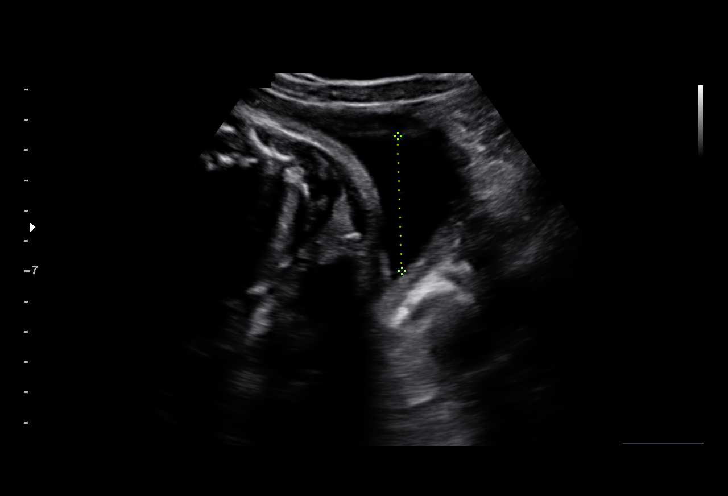
[im 18/34]
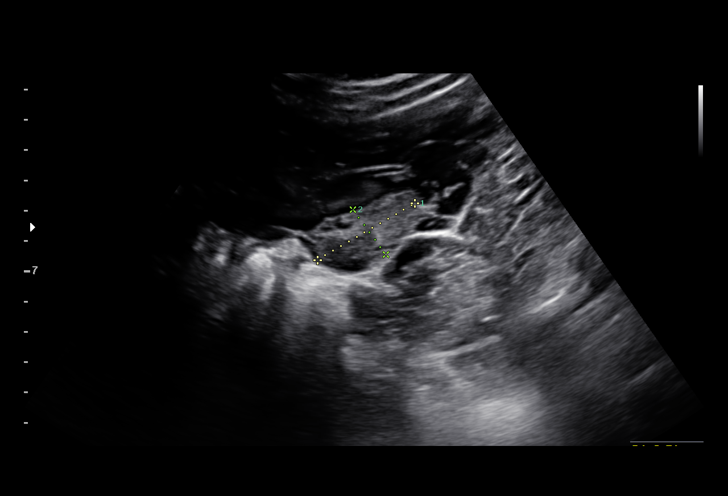
[im 19/34]
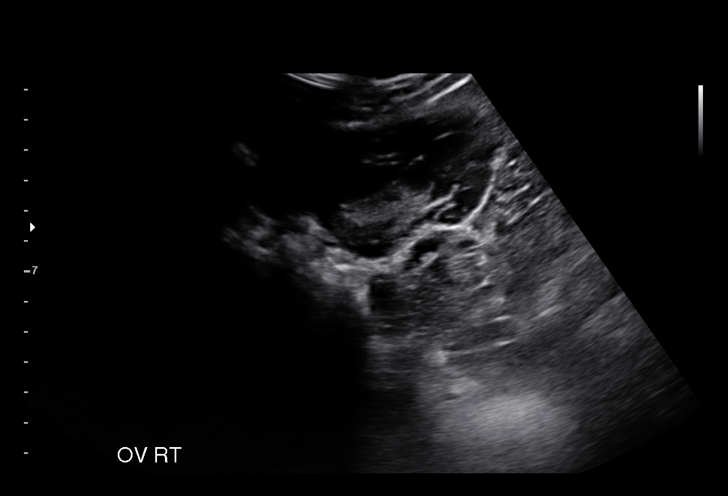
[im 21/34]
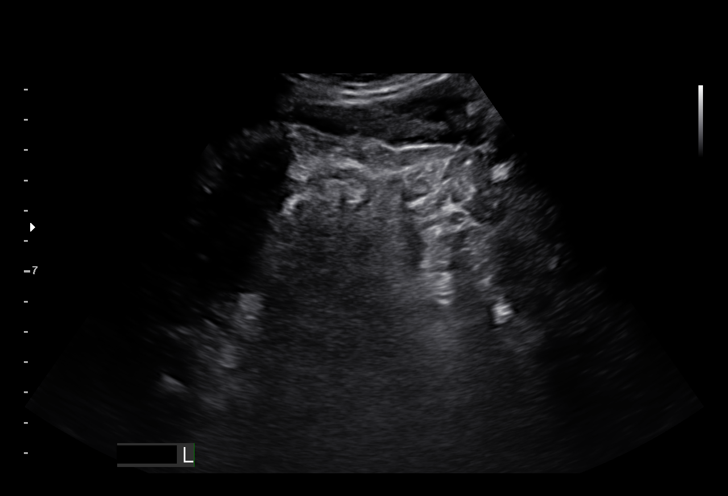
[im 24/34]
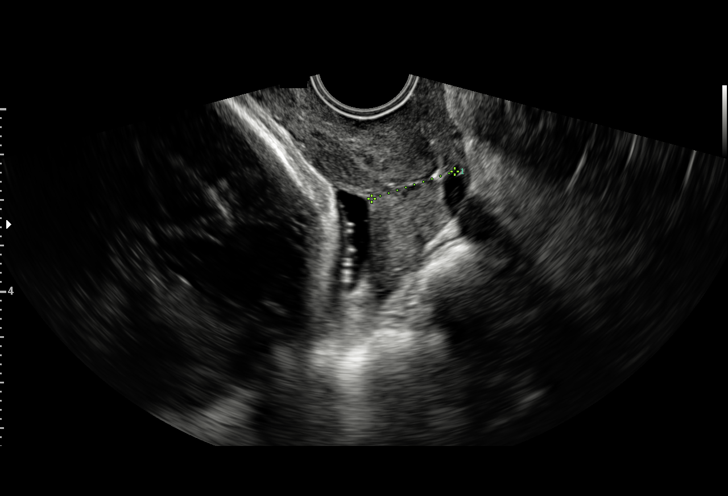
[im 26/34]
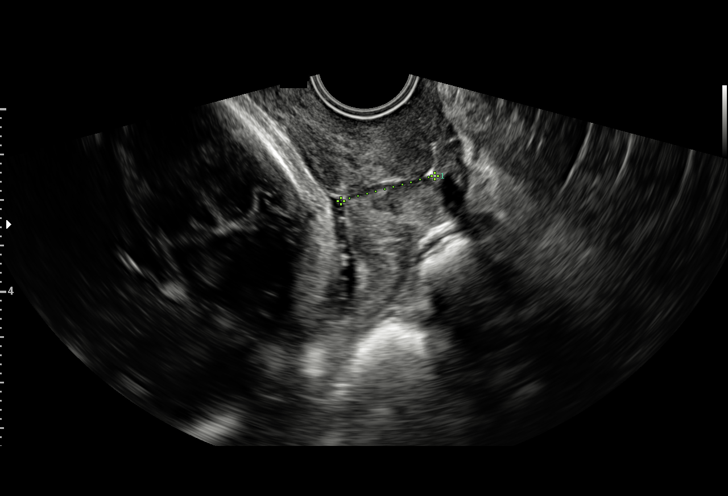
[im 29/34]
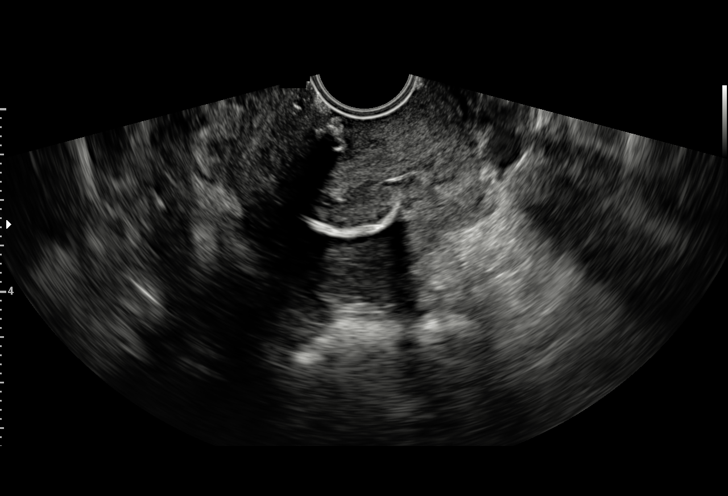
[im 31/34]
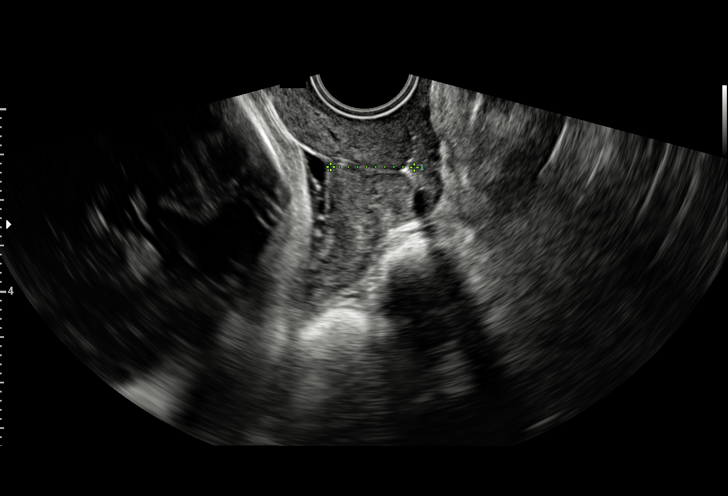
[im 34/34]
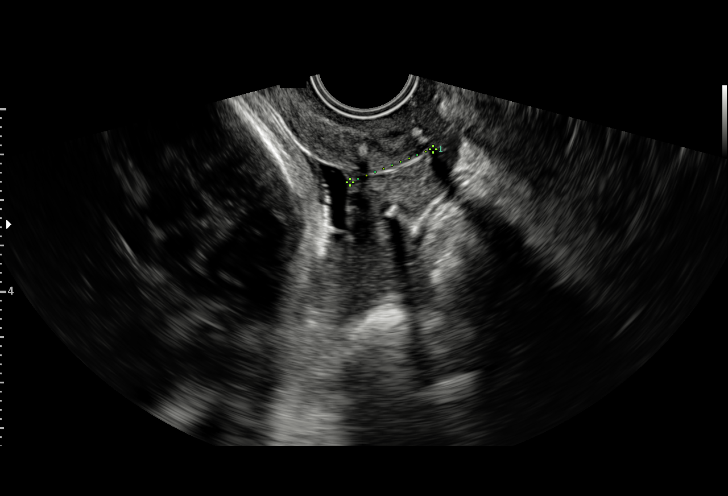

[15 of 28 positions shown; findings below may reference images not displayed]

pm)

Name:       DAYRON SYVERSON                        Visit  11/13/2015 [DATE]
Date:

[address]
Attending:         Daniu Henrotte       Secondary          [facility] Nursing-
BORIS                    Castillo.:              MAU/Triage

1  YASHVEN NURY             [id number]       [id number]     [id number]
2  YASHVEN NURY             [id number]       [id number]     [id number]
Indications

Vaginal bleeding in pregnancy, third
trimester
Cervical cerclage suture present, third
trimester
Cervical insufficiency,3rd
32 weeks gestation of pregnancy
OB History

Gravidity:     6         Term:  2        Prem:    0        SAB:   3
TOP:           0       Ectopic  0        Living:  2
:
Fetal Evaluation

Num Of Fetuses:      1
Fetal Heart          144
Rate(bpm):
Cardiac Activity:    Observed
Presentation:        Cephalic
Placenta:            Anterior, above cervical os
Amniotic Fluid
AFI FV:      Subjectively within normal limits
AFI Sum:     21.11    cm      80  %Tile     Larg Pckt:    6.37   cm
RUQ:   6.37    cm    RLQ:   4.68    cm   LUQ:    4.44    cm   LLQ:    5.62   cm
Gestational Age

LMP:           32w 6d        Date:  03/28/15                  EDD:   01/02/16
Best:          32w 6d    Det. By:   LMP  (03/28/15)           EDD:   01/02/16
Cervix Uterus Adnexa

Cervix
Length:               2  cm.
Cerclage visualized.

Left Ovary
Within normal limits.

Right Ovary
Within normal limits.
Impression

Single IUP at 32w 6d
Limited ultrasound performed
Cephalic presentation
Anterior placenta without previa; no subchorionic fluid
collections noted
Normal amniotic fluid volume

TVUS - cervical length 2 cm.  Cerclage stitch appears
intact.
Recommendations

Follow-up ultrasounds as clinically indicated.

## 2018-07-22 ENCOUNTER — Other Ambulatory Visit: Payer: Self-pay

## 2018-07-22 ENCOUNTER — Emergency Department (INDEPENDENT_AMBULATORY_CARE_PROVIDER_SITE_OTHER)
Admission: EM | Admit: 2018-07-22 | Discharge: 2018-07-22 | Disposition: A | Discharge status: Home / Self Care | Payer: 59 | Source: Home / Self Care | Attending: Family Medicine | Admitting: Family Medicine

## 2018-07-22 DIAGNOSIS — J020 Streptococcal pharyngitis: Secondary | ICD-10-CM

## 2018-07-22 LAB — POCT RAPID STREP A (OFFICE): Rapid Strep A Screen: POSITIVE — AB

## 2018-07-22 MED ORDER — AMOXICILLIN 500 MG PO CAPS: 500.0000 mg | ORAL_CAPSULE | Freq: Two times a day (BID) | ORAL | 0 refills | Status: AC

## 2018-07-22 NOTE — ED Triage Notes (Signed)
Pt had right sided neck pain late last week, and sore throat started this past weekend.  Husband was dx with strep Monday.

## 2018-07-22 NOTE — Discharge Instructions (Signed)
°  Please take antibiotics as prescribed and be sure to complete entire course even if you start to feel better to ensure infection does not come back. ° °Please follow up with family medicine in 1 week if not improving.  °

## 2018-07-22 NOTE — ED Provider Notes (Signed)
[facility]    CSN: [id number] Arrival date & time: 07/22/18  0820     History   Chief Complaint Chief Complaint  Patient presents with  . Sore Throat    HPI Jodi Solis is a 37 y.o. female.   HPI Jodi Solis is a 37 y.o. female presenting to UC with c/o Right sided neck pain that started last week, associated mild headache last week, and sore throat that started 3 days ago. Her husband tested positive for strep throat 2 days ago.  She has taken ibuprofen with moderate relief. She did have a temp of 100*F this weekend. Denies n/v/d.    Past Medical History:  Diagnosis Date  . Anxiety 2011, 2013  . Medical history non-contributory   . Vaginal delivery 20014, 2015    Patient Active Problem List   Diagnosis Date Noted  . Labor and delivery, indication for care 12/18/2015  . SVD (spontaneous vaginal delivery) 12/18/2015  . H/O incompetent cervix, currently pregnant--cerclage x 2 05/06/2015  . 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 Infertility, female 05/06/2015  . Spotting affecting pregnancy in first trimester 05/06/2015  . Hypothyroidism 05/06/2015    Past Surgical History:  Procedure Laterality Date  . CERVICAL CERCLAGE N/A 07/05/2015   Procedure: CERCLAGE CERVICAL;  Surgeon: Osborn Coho, MD;  Location: [facility];  Service: Gynecology;  Laterality: N/A;  . DILATION AND CURETTAGE OF UTERUS      OB History    Gravida  6   Para  3   Term  3   Preterm      AB  3   Living  3     SAB      TAB      Ectopic      Multiple  0   Live Births  3            Home Medications    Prior to Admission medications   Medication Sig Start Date End Date Taking? Authorizing Provider  acetaminophen (TYLENOL) 325 MG tablet Take 2 tablets (650 mg total) by mouth every 4 (four) hours as needed (for pain scale < 4). 12/20/15   Standard, Venus, CNM  amoxicillin (AMOXIL) 500 MG capsule Take 1  capsule (500 mg total) by mouth 2 (two) times daily for 10 days. 07/22/18 08/01/18  Lurene Shadow, PA-C  ibuprofen (ADVIL,MOTRIN) 600 MG tablet Take 1 tablet (600 mg total) by mouth every 6 (six) hours. 12/20/15   Standard, Venus, CNM  Prenatal Vit-Fe Fumarate-FA (PRENATAL MULTIVITAMIN) TABS tablet Take 1 tablet by mouth at bedtime.    [provider]  senna-docusate (SENOKOT-S) 8.6-50 MG tablet Take 2 tablets by mouth at bedtime as needed for mild constipation. 12/20/15   Standard, Venus, CNM    Family History History reviewed. No pertinent family history.  Social History Social History   Tobacco Use  . Smoking status: Never Smoker  Substance Use Topics  . Alcohol use: No  . Drug use: No     Allergies   Patient has no known allergies.   Review of Systems Review of Systems  Constitutional: Positive for fever. Negative for chills.  HENT: Positive for sore throat. Negative for congestion, ear pain, trouble swallowing and voice change.   Respiratory: Negative for cough and shortness of breath.   Cardiovascular: Negative for chest pain and palpitations.  Gastrointestinal: Negative for abdominal pain, diarrhea, nausea and vomiting.  Musculoskeletal: Negative for arthralgias, back pain  and myalgias.  Skin: Negative for rash.  Neurological: Positive for headaches.     Physical Exam Triage Vital Signs ED Triage Vitals [07/22/18 0848]  Enc Vitals Group     BP 110/76     Pulse Rate 72     Resp      Temp 98.3 F (36.8 C)     Temp Source Oral     SpO2 98 %     Weight 127 lb (57.6 kg)     Height 5\' 2"  (1.575 m)     Head Circumference      Peak Flow      Pain Score 3     Pain Loc      Pain Edu?      Excl. in GC?    No data found.  Updated Vital Signs BP 110/76 (BP Location: Right Arm)   Pulse 72   Temp 98.3 F (36.8 C) (Oral)   Ht 5\' 2"  (1.575 m)   Wt 127 lb (57.6 kg)   LMP 07/13/2018   SpO2 98%   BMI 23.23 kg/m   Visual Acuity Right Eye Distance:     Left Eye Distance:   Bilateral Distance:    Right Eye Near:   Left Eye Near:    Bilateral Near:     Physical Exam  Constitutional: She is oriented to person, place, and time. She appears well-developed and well-nourished.  Non-toxic appearance. She does not appear ill. No distress.  HENT:  Head: Normocephalic and atraumatic.  Right Ear: Tympanic membrane normal.  Left Ear: Tympanic membrane normal.  Nose: Nose normal.  Mouth/Throat: Uvula is midline and mucous membranes are normal. Posterior oropharyngeal erythema present.  Eyes: EOM are normal.  Neck: Normal range of motion. Neck supple.  Cardiovascular: Normal rate and regular rhythm.  Pulmonary/Chest: Effort normal and breath sounds normal.  Musculoskeletal: Normal range of motion.  Lymphadenopathy:    She has cervical adenopathy.  Neurological: She is alert and oriented to person, place, and time.  Skin: Skin is warm and dry.  Psychiatric: She has a normal mood and affect. Her behavior is normal.  Nursing note and vitals reviewed.    UC Treatments / Results  Labs (all labs ordered are listed, but only abnormal results are displayed) Labs Reviewed  POCT RAPID STREP A (OFFICE) - Abnormal; Notable for the following components:      Result Value   Rapid Strep A Screen Positive (*)    All other components within normal limits    EKG None  Radiology No results found.  Procedures Procedures (including critical care time)  Medications Ordered in UC Medications - No data to display  Initial Impression / Assessment and Plan / UC Course  I have reviewed the triage vital signs and the nursing notes.  Pertinent labs & imaging results that were available during my care of the patient were reviewed by me and considered in my medical decision making (see chart for details).     Rapid strep: POSITIVE Rx: amoxicillin Home instructions provided.  Final Clinical Impressions(s) / UC Diagnoses   Final diagnoses:   Strep throat     Discharge Instructions      Please take antibiotics as prescribed and be sure to complete entire course even if you start to feel better to ensure infection does not come back.  Please follow up with family medicine in 1 week if not improving.     ED Prescriptions    Medication Sig Dispense Auth. Provider  amoxicillin (AMOXIL) 500 MG capsule Take 1 capsule (500 mg total) by mouth 2 (two) times daily for 10 days. 20 capsule Lurene Shadow, PA-C     Controlled Substance Prescriptions [place] Controlled Substance Registry consulted? Not Applicable   Rolla Plate 07/22/18 1610

## 2018-11-05 ENCOUNTER — Encounter: Payer: Self-pay | Admitting: *Deleted

## 2018-11-05 ENCOUNTER — Emergency Department (INDEPENDENT_AMBULATORY_CARE_PROVIDER_SITE_OTHER)
Admission: EM | Admit: 2018-11-05 | Discharge: 2018-11-05 | Disposition: A | Discharge status: Home / Self Care | Payer: Federal, State, Local not specified - PPO | Source: Home / Self Care | Attending: Family Medicine | Admitting: Family Medicine

## 2018-11-05 DIAGNOSIS — R69 Illness, unspecified: Secondary | ICD-10-CM

## 2018-11-05 DIAGNOSIS — J111 Influenza due to unidentified influenza virus with other respiratory manifestations: Secondary | ICD-10-CM

## 2018-11-05 MED ORDER — ACETAMINOPHEN 500 MG PO TABS
1000.0000 mg | ORAL_TABLET | Freq: Once | ORAL | Status: AC
  Administered 2018-11-05: 1000 mg via ORAL

## 2018-11-05 MED ORDER — OSELTAMIVIR PHOSPHATE 75 MG PO CAPS: 75.0000 mg | ORAL_CAPSULE | Freq: Two times a day (BID) | ORAL | 0 refills | Status: DC

## 2018-11-05 NOTE — ED Provider Notes (Signed)
[facility]    CSN: [id number] Arrival date & time: 11/05/18  1237     History   Chief Complaint Chief Complaint  Patient presents with  . Fever  . Headache  . Generalized Body Aches  . Chills  . Cough    HPI Jodi Solis is a 38 y.o. female.   Last night patient suddenly developed flu-like illness including myalgias, headache, fever/chills, fatigue, and cough.  Also has mild nasal congestion and sore throat.  Cough is non-productive and worse at night.  No pleuritic pain or shortness of breath.  She has not had a flu shot this season.  Her daughter has the flu.  The history is provided by the patient.    Past Medical History:  Diagnosis Date  . Anxiety 2011, 2013  . Medical history non-contributory   . Vaginal delivery 20014, 2015    Patient Active Problem List   Diagnosis Date Noted  . Labor and delivery, indication for care 12/18/2015  . SVD (spontaneous vaginal delivery) 12/18/2015  . H/O incompetent cervix, currently pregnant--cerclage x 2 05/06/2015  . 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 Infertility, female 05/06/2015  . Spotting affecting pregnancy in first trimester 05/06/2015  . Hypothyroidism 05/06/2015    Past Surgical History:  Procedure Laterality Date  . CERVICAL CERCLAGE N/A 07/05/2015   Procedure: CERCLAGE CERVICAL;  Surgeon: Osborn CohoAngela Roberts, MD;  Location: [facility];  Service: Gynecology;  Laterality: N/A;  . DILATION AND CURETTAGE OF UTERUS      OB History    Gravida  6   Para  3   Term  3   Preterm      AB  3   Living  3     SAB      TAB      Ectopic      Multiple  0   Live Births  3            Home Medications    Prior to Admission medications   Medication Sig Start Date End Date Taking? Authorizing Provider  acetaminophen (TYLENOL) 325 MG tablet Take 2 tablets (650 mg total) by mouth every 4 (four) hours as needed (for pain scale < 4).  12/20/15   Standard, Venus, CNM  ibuprofen (ADVIL,MOTRIN) 600 MG tablet Take 1 tablet (600 mg total) by mouth every 6 (six) hours. 12/20/15   Standard, Venus, CNM  oseltamivir (TAMIFLU) 75 MG capsule Take 1 capsule (75 mg total) by mouth every 12 (twelve) hours. 11/05/18   Lattie HawBeese, Fiza Nation A, MD  Prenatal Vit-Fe Fumarate-FA (PRENATAL MULTIVITAMIN) TABS tablet Take 1 tablet by mouth at bedtime.    [provider]  senna-docusate (SENOKOT-S) 8.6-50 MG tablet Take 2 tablets by mouth at bedtime as needed for mild constipation. 12/20/15   Standard, Venus, CNM    Family History Family History  Problem Relation Age of Onset  . Healthy Mother   . Healthy Father     Social History Social History   Tobacco Use  . Smoking status: Never Smoker  . Smokeless tobacco: Never Used  Substance Use Topics  . Alcohol use: No  . Drug use: No     Allergies   Patient has no known allergies.   Review of Systems Review of Systems + sore throat + cough No pleuritic pain No wheezing + nasal congestion + post-nasal drainage No sinus pain/pressure No itchy/red eyes No earache No hemoptysis No SOB + fever, +  chills No nausea No vomiting No abdominal pain No diarrhea No urinary symptoms No skin rash + fatigue + myalgias + headache Used OTC meds without relief   Physical Exam Triage Vital Signs ED Triage Vitals  Enc Vitals Group     BP 11/05/18 1320 108/73     Pulse Rate 11/05/18 1320 74     Resp 11/05/18 1320 16     Temp 11/05/18 1320 (!) 103 F (39.4 C)     Temp Source 11/05/18 1320 Oral     SpO2 11/05/18 1320 96 %     Weight 11/05/18 1321 130 lb (59 kg)     Height 11/05/18 1321 5\' 2"  (1.575 m)     Head Circumference --      Peak Flow --      Pain Score 11/05/18 1321 0     Pain Loc --      Pain Edu? --      Excl. in GC? --    No data found.  Updated Vital Signs BP 108/73 (BP Location: Right Arm)   Pulse 74   Temp (!) 103 F (39.4 C) (Oral)   Resp 16   Ht 5\' 2"   (1.575 m)   Wt 59 kg   SpO2 96%   BMI 23.78 kg/m   Visual Acuity Right Eye Distance:   Left Eye Distance:   Bilateral Distance:    Right Eye Near:   Left Eye Near:    Bilateral Near:     Physical Exam Nursing notes and Vital Signs reviewed. Appearance:  Patient appears stated age, and in no acute distress Eyes:  Pupils are equal, round, and reactive to light and accomodation.  Extraocular movement is intact.  Conjunctivae are not inflamed  Ears:  Canals normal.  Tympanic membranes normal.  Nose:  Mildly congested turbinates.  No sinus tenderness.  Pharynx:  Normal Neck:  Supple.  Enlarged posterior/lateral nodes are palpated bilaterally, tender to palpation on the left.   Lungs:  Clear to auscultation.  Breath sounds are equal.  Moving air well. Heart:  Regular rate and rhythm without murmurs, rubs, or gallops.  Abdomen:  Nontender without masses or hepatosplenomegaly.  Bowel sounds are present.  No CVA or flank tenderness.  Extremities:  No edema.  Skin:  No rash present.    UC Treatments / Results  Labs (all labs ordered are listed, but only abnormal results are displayed) Labs Reviewed - No data to display  EKG None  Radiology No results found.  Procedures Procedures (including critical care time)  Medications Ordered in UC Medications  acetaminophen (TYLENOL) tablet 1,000 mg (1,000 mg Oral Given 11/05/18 1326)    Initial Impression / Assessment and Plan / UC Course  I have reviewed the triage vital signs and the nursing notes.  Pertinent labs & imaging results that were available during my care of the patient were reviewed by me and considered in my medical decision making (see chart for details).    Begin Tamiflu. Followup with Family Doctor if not improved in one week.    Final Clinical Impressions(s) / UC Diagnoses   Final diagnoses:  Influenza-like illness     Discharge Instructions     Take plain guaifenesin (1200mg  extended release tabs such  as Mucinex) twice daily, with plenty of water, for cough and congestion.  May add Pseudoephedrine (30mg , one or two every 4 to 6 hours) for sinus congestion.  Get adequate rest.   May use Afrin nasal spray (or generic  oxymetazoline) each morning for about 5 days and then discontinue.  Also recommend using saline nasal spray several times daily and saline nasal irrigation (AYR is a common brand).    Try warm salt water gargles for sore throat.  Stop all antihistamines for now, and other non-prescription cough/cold preparations. May take Delsym Cough Suppressant at bedtime for nighttime cough.  May take Tylenol, or Ibuprofen 200mg , 4 tabs every 8 hours with food for body aches, fever, etc.      ED Prescriptions    Medication Sig Dispense Auth. Provider   oseltamivir (TAMIFLU) 75 MG capsule Take 1 capsule (75 mg total) by mouth every 12 (twelve) hours. 10 capsule Lattie Haw, MD         Lattie Haw, MD 11/05/18 [phone]

## 2018-11-05 NOTE — ED Triage Notes (Signed)
Non-productive cough, fever, body aches, chills, congestion, onset yesterday evening.

## 2018-11-05 NOTE — Discharge Instructions (Addendum)
Take plain guaifenesin (1200mg  extended release tabs such as Mucinex) twice daily, with plenty of water, for cough and congestion.  May add Pseudoephedrine (30mg , one or two every 4 to 6 hours) for sinus congestion.  Get adequate rest.   May use Afrin nasal spray (or generic oxymetazoline) each morning for about 5 days and then discontinue.  Also recommend using saline nasal spray several times daily and saline nasal irrigation (AYR is a common brand).    Try warm salt water gargles for sore throat.  Stop all antihistamines for now, and other non-prescription cough/cold preparations. May take Delsym Cough Suppressant at bedtime for nighttime cough.  May take Tylenol, or Ibuprofen 200mg , 4 tabs every 8 hours with food for body aches, fever, etc.

## 2023-03-05 ENCOUNTER — Ambulatory Visit
Admission: EM | Admit: 2023-03-05 | Discharge: 2023-03-05 | Disposition: A | Discharge status: Home / Self Care | Payer: Federal, State, Local not specified - PPO | Attending: Family Medicine | Admitting: Family Medicine

## 2023-03-05 ENCOUNTER — Encounter: Payer: Self-pay | Admitting: Emergency Medicine

## 2023-03-05 DIAGNOSIS — J0100 Acute maxillary sinusitis, unspecified: Secondary | ICD-10-CM | POA: Diagnosis not present

## 2023-03-05 MED ORDER — AMOXICILLIN-POT CLAVULANATE 875-125 MG PO TABS: 1.0000 | ORAL_TABLET | Freq: Two times a day (BID) | ORAL | 0 refills | Status: AC

## 2023-03-05 NOTE — ED Provider Notes (Signed)
[facility]    CSN: [id number] Arrival date & time: 03/05/23  1909      History   Chief Complaint Chief Complaint  Patient presents with   Sore Throat    Now also nasal congestion - Entered by patient    HPI Jodi Solis is a 42 y.o. female.   HPI Pleasant 42 year old female presents with sore throat and nasal congestion for 5 days. PMH significant for hypothyroidism.  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, with one SAB of twins 05/06/2015   Infertility, female 05/06/2015   Spotting affecting pregnancy in first trimester 05/06/2015   Hypothyroidism 05/06/2015    Past Surgical History:  Procedure Laterality Date   CERVICAL CERCLAGE N/A 07/05/2015   Procedure: CERCLAGE CERVICAL;  Surgeon: Osborn Coho, MD;  Location: [facility];  Service: Gynecology;  Laterality: N/A;   DILATION AND CURETTAGE OF UTERUS      OB History     Gravida  6   Para  3   Term  3   Preterm      AB  3   Living  3      SAB      IAB      Ectopic      Multiple  0   Live Births  3            Home Medications    Prior to Admission medications   Medication Sig Start Date End Date Taking? Authorizing Provider  amoxicillin-clavulanate (AUGMENTIN) 875-125 MG tablet Take 1 tablet by mouth 2 (two) times daily for 10 days. 03/05/23 03/15/23 Yes Trevor Iha, FNP  acetaminophen (TYLENOL) 325 MG tablet Take 2 tablets (650 mg total) by mouth every 4 (four) hours as needed (for pain scale < 4). 12/20/15   Standard, Venus, CNM  ibuprofen (ADVIL,MOTRIN) 600 MG tablet Take 1 tablet (600 mg total) by mouth  every 6 (six) hours. 12/20/15   Standard, Venus, CNM  Prenatal Vit-Fe Fumarate-FA (PRENATAL MULTIVITAMIN) TABS tablet Take 1 tablet by mouth at bedtime.    [provider]  senna-docusate (SENOKOT-S) 8.6-50 MG tablet Take 2 tablets by mouth at bedtime as needed for mild constipation. 12/20/15   Standard, Venus, CNM    Family History Family History  Problem Relation Age of Onset   Healthy Mother    Healthy Father     Social History Social History   Tobacco Use   Smoking status: Never   Smokeless tobacco: Never  Vaping Use   Vaping Use: Never used  Substance Use Topics   Alcohol use: No   Drug use: No     Allergies   Patient has no known allergies.   Review of Systems Review of Systems  HENT:  Positive for congestion and sore throat.   Respiratory:  Positive for cough.   All other systems reviewed and are negative.    Physical Exam Triage Vital Signs ED Triage Vitals  Enc Vitals Group     BP      Pulse      Resp      Temp      Temp src  SpO2      Weight      Height      Head Circumference      Peak Flow      Pain Score      Pain Loc      Pain Edu?      Excl. in GC?    No data found.  Updated Vital Signs BP 119/83 (BP Location: Right Arm)   Pulse 81   Temp 98.6 F (37 C) (Oral)   Resp 18   SpO2 99%   Breastfeeding No    Physical Exam Vitals and nursing note reviewed.  Constitutional:      General: She is not in acute distress.    Appearance: Normal appearance. She is normal weight. She is not ill-appearing.  HENT:     Head: Normocephalic and atraumatic.     Right Ear: Tympanic membrane and external ear normal.     Left Ear: Tympanic membrane and external ear normal.     Ears:     Comments: Moderate eustachian tube dysfunction noted bilaterally    Mouth/Throat:     Mouth: Mucous membranes are moist.     Pharynx: Oropharynx is clear.  Eyes:     Extraocular Movements: Extraocular movements intact.     Conjunctiva/sclera:  Conjunctivae normal.     Pupils: Pupils are equal, round, and reactive to light.  Cardiovascular:     Rate and Rhythm: Normal rate and regular rhythm.     Pulses: Normal pulses.     Heart sounds: Normal heart sounds.  Pulmonary:     Effort: Pulmonary effort is normal.     Breath sounds: Normal breath sounds. No wheezing, rhonchi or rales.  Musculoskeletal:        General: Normal range of motion.     Cervical back: Normal range of motion and neck supple.  Skin:    General: Skin is warm and dry.  Neurological:     General: No focal deficit present.     Mental Status: She is alert and oriented to person, place, and time. Mental status is at baseline.  Psychiatric:        Mood and Affect: Mood normal.        Behavior: Behavior normal.        Thought Content: Thought content normal.      UC Treatments / Results  Labs (all labs ordered are listed, but only abnormal results are displayed) Labs Reviewed - No data to display  EKG   Radiology No results found.  Procedures Procedures (including critical care time)  Medications Ordered in UC Medications - No data to display  Initial Impression / Assessment and Plan / UC Course  I have reviewed the triage vital signs and the nursing notes.  Pertinent labs & imaging results that were available during my care of the patient were reviewed by me and considered in my medical decision making (see chart for details).     MDM: 1.  Acute maxillary sinusitis, recurrence not specified-Rx'd Augmentin 875/125 mg twice daily x 10 days. Instructed patient to take medication as directed with food to completion.  Encouraged increase daily water intake to 64 ounces per day while taking this medication.  Advised if symptoms worsen and/or unresolved please follow-up with PCP or here for further evaluation. Patient discharged home, hemodynamically stable Final Clinical Impressions(s) / UC Diagnoses   Final diagnoses:  Acute maxillary sinusitis,  recurrence not specified     Discharge Instructions  Instructed patient to take medication as directed with food to completion.  Encouraged increase daily water intake to 64 ounces per day while taking this medication.  Advised if symptoms worsen and/or unresolved please follow-up with PCP or here for further evaluation.     ED Prescriptions     Medication Sig Dispense Auth. Provider   amoxicillin-clavulanate (AUGMENTIN) 875-125 MG tablet Take 1 tablet by mouth 2 (two) times daily for 10 days. 20 tablet Trevor Iha, FNP      PDMP not reviewed this encounter.   Trevor Iha, FNP 03/05/23 1945

## 2023-03-05 NOTE — ED Triage Notes (Signed)
Pt states she had a sore throat over the weekend but over the last couple days she has developed cough, congestion and headache. She has not taken any OTC meds for this. Afebrile,

## 2023-03-05 NOTE — Discharge Instructions (Addendum)
Instructed patient to take medication as directed with food to completion.  Encouraged increase daily water intake to 64 ounces per day while taking this medication.  Advised if symptoms worsen and/or unresolved please follow-up with PCP or here for further evaluation.
